# Patient Record
Sex: Male | Born: 1955 | Race: White | Hispanic: No | Marital: Married | Smoking: Never smoker
Health system: Southern US, Community
[De-identification: ages and names within clinical notes are randomized; demographics above are authoritative.]

## PROBLEM LIST (undated history)

## (undated) DIAGNOSIS — T4145XA Adverse effect of unspecified anesthetic, initial encounter: Secondary | ICD-10-CM

## (undated) DIAGNOSIS — T8859XA Other complications of anesthesia, initial encounter: Secondary | ICD-10-CM

## (undated) DIAGNOSIS — E7800 Pure hypercholesterolemia, unspecified: Secondary | ICD-10-CM

## (undated) DIAGNOSIS — N400 Enlarged prostate without lower urinary tract symptoms: Secondary | ICD-10-CM

## (undated) DIAGNOSIS — I10 Essential (primary) hypertension: Secondary | ICD-10-CM

## (undated) DIAGNOSIS — M1990 Unspecified osteoarthritis, unspecified site: Secondary | ICD-10-CM

## (undated) HISTORY — PX: APPENDECTOMY: SHX54

## (undated) HISTORY — PX: COLON SURGERY: SHX602

## (undated) HISTORY — PX: KNEE ARTHROSCOPY: SUR90

## (undated) HISTORY — PX: OTHER SURGICAL HISTORY: SHX169

## (undated) HISTORY — PX: VASECTOMY: SHX75

---
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
## 2015-06-13 ENCOUNTER — Other Ambulatory Visit: Payer: Self-pay | Admitting: Orthopaedic Surgery

## 2015-06-23 ENCOUNTER — Encounter (HOSPITAL_COMMUNITY)
Admission: RE | Admit: 2015-06-23 | Discharge: 2015-06-23 | Disposition: A | Discharge status: Home / Self Care | Payer: BLUE CROSS/BLUE SHIELD | Source: Ambulatory Visit | Attending: Orthopaedic Surgery | Admitting: Orthopaedic Surgery

## 2015-06-23 ENCOUNTER — Encounter (HOSPITAL_COMMUNITY): Payer: Self-pay

## 2015-06-23 DIAGNOSIS — R918 Other nonspecific abnormal finding of lung field: Secondary | ICD-10-CM | POA: Diagnosis not present

## 2015-06-23 DIAGNOSIS — Z0183 Encounter for blood typing: Secondary | ICD-10-CM | POA: Insufficient documentation

## 2015-06-23 DIAGNOSIS — Z01812 Encounter for preprocedural laboratory examination: Secondary | ICD-10-CM | POA: Insufficient documentation

## 2015-06-23 DIAGNOSIS — M1611 Unilateral primary osteoarthritis, right hip: Secondary | ICD-10-CM | POA: Insufficient documentation

## 2015-06-23 DIAGNOSIS — I10 Essential (primary) hypertension: Secondary | ICD-10-CM | POA: Diagnosis not present

## 2015-06-23 DIAGNOSIS — Z01818 Encounter for other preprocedural examination: Secondary | ICD-10-CM

## 2015-06-23 HISTORY — DX: Unspecified osteoarthritis, unspecified site: M19.90

## 2015-06-23 HISTORY — DX: Pure hypercholesterolemia, unspecified: E78.00

## 2015-06-23 HISTORY — DX: Essential (primary) hypertension: I10

## 2015-06-23 LAB — URINALYSIS, ROUTINE W REFLEX MICROSCOPIC
Bilirubin Urine: NEGATIVE
GLUCOSE, UA: NEGATIVE mg/dL
HGB URINE DIPSTICK: NEGATIVE
KETONES UR: NEGATIVE mg/dL
Leukocytes, UA: NEGATIVE
Nitrite: NEGATIVE
Protein, ur: NEGATIVE mg/dL
Specific Gravity, Urine: 1.015 (ref 1.005–1.030)
UROBILINOGEN UA: 0.2 mg/dL (ref 0.0–1.0)
pH: 6 (ref 5.0–8.0)

## 2015-06-23 LAB — TYPE AND SCREEN
ABO/RH(D): A POS
Antibody Screen: NEGATIVE

## 2015-06-23 LAB — ABO/RH: ABO/RH(D): A POS

## 2015-06-23 LAB — CBC WITH DIFFERENTIAL/PLATELET
Basophils Absolute: 0 10*3/uL (ref 0.0–0.1)
Basophils Relative: 0 % (ref 0–1)
EOS PCT: 2 % (ref 0–5)
Eosinophils Absolute: 0.1 10*3/uL (ref 0.0–0.7)
HEMATOCRIT: 45.1 % (ref 39.0–52.0)
Hemoglobin: 14.9 g/dL (ref 13.0–17.0)
LYMPHS PCT: 26 % (ref 12–46)
Lymphs Abs: 1.9 10*3/uL (ref 0.7–4.0)
MCH: 31.2 pg (ref 26.0–34.0)
MCHC: 33 g/dL (ref 30.0–36.0)
MCV: 94.4 fL (ref 78.0–100.0)
MONO ABS: 0.5 10*3/uL (ref 0.1–1.0)
Monocytes Relative: 7 % (ref 3–12)
NEUTROS ABS: 4.6 10*3/uL (ref 1.7–7.7)
Neutrophils Relative %: 65 % (ref 43–77)
Platelets: 205 10*3/uL (ref 150–400)
RBC: 4.78 MIL/uL (ref 4.22–5.81)
RDW: 13.3 % (ref 11.5–15.5)
WBC: 7.2 10*3/uL (ref 4.0–10.5)

## 2015-06-23 LAB — PROTIME-INR
INR: 1.02 (ref 0.00–1.49)
PROTHROMBIN TIME: 13.7 s (ref 11.6–15.2)

## 2015-06-23 LAB — BASIC METABOLIC PANEL
Anion gap: 7 (ref 5–15)
BUN: 15 mg/dL (ref 6–20)
CO2: 30 mmol/L (ref 22–32)
Calcium: 9.6 mg/dL (ref 8.9–10.3)
Chloride: 101 mmol/L (ref 101–111)
Creatinine, Ser: 1.36 mg/dL — ABNORMAL HIGH (ref 0.61–1.24)
GFR calc Af Amer: >60 mL/min (ref >60)
GFR, EST NON AFRICAN AMERICAN: 55 mL/min — AB (ref >60)
Glucose, Bld: 124 mg/dL — ABNORMAL HIGH (ref 65–99)
POTASSIUM: 3.7 mmol/L (ref 3.5–5.1)
SODIUM: 138 mmol/L (ref 135–145)

## 2015-06-23 LAB — APTT: aPTT: 27 seconds (ref 24–37)

## 2015-06-23 LAB — SURGICAL PCR SCREEN
MRSA, PCR: NEGATIVE
STAPHYLOCOCCUS AUREUS: POSITIVE — AB

## 2015-06-23 NOTE — Progress Notes (Signed)
PCP  Is Dr. Tawny Asal @ [facility] in [place] Saw Dr. Sondra Come, who is with [facility]  [phone].  LOV was back in June he thinks. Had Echo and Cardiolite tests done around 4 yrs ago (his wife worked there and wanted him in tip top shape) no real compaints or issues tho.  (will request that info).

## 2015-06-23 NOTE — Pre-Procedure Instructions (Signed)
Timothy Rich  06/23/2015      [facility] [address], [place] [address] Phone: [phone] Fax: [phone]    Your procedure is scheduled on  Tuesday 07/04/15  Report to [facility] Admitting at 820 A.M.  Call this number if you have problems the morning of surgery:  [phone]   Remember:  Do not eat food or drink liquids after midnight.  Take these medicines the morning of surgery with A SIP OF WATER   (STOP DICLOFENAC/ VOLTAREN)   Do not wear jewelry - no rings or watches.  Do not wear lotions or colognes.   You may NOT wear deodorant the day of surgery.             Men may shave face and neck.   Do not bring valuables to the hospital.  [facility] is not responsible for any belongings or valuables.  Contacts, dentures or bridgework may not be worn into surgery.  Leave your suitcase in the car.  After surgery it may be brought to your room. For patients admitted to the hospital, discharge time will be determined by your treatment team.  Name and phone number of your driver:    Special instructions:  [place] - Preparing for Surgery  Before surgery, you can play an important role.  Because skin is not sterile, your skin needs to be as free of germs as possible.  You can reduce the number of germs on you skin by washing with CHG (chlorahexidine gluconate) soap before surgery.  CHG is an antiseptic cleaner which kills germs and bonds with the skin to continue killing germs even after washing.  Please DO NOT use if you have an allergy to CHG or antibacterial soaps.  If your skin becomes reddened/irritated stop using the CHG and inform your nurse when you arrive at Short Stay.  Do not shave (including legs and underarms) for at least 48 hours prior to the first CHG shower.  You may shave your face.  Please follow these instructions carefully:   1.  Shower with CHG Soap the night before surgery and the                                 morning of Surgery.  2.  If you choose to wash your hair, wash your hair first as usual with your normal shampoo.  3.  After you shampoo, rinse your hair and body thoroughly to remove the shampoo.  4.  Use CHG as you would any other liquid soap.  You can apply chg directly to the skin and wash gently with scrungie or a clean washcloth.  5.  Apply the CHG Soap to your body ONLY FROM THE NECK DOWN.  Do not use on open wounds or open sores.  Avoid contact with your eyes, ears, mouth and genitals (private parts).  Wash genitals (private parts)  with your normal soap.  6.  Wash thoroughly, paying special attention to the area where your surgery will be performed.  7.  Thoroughly rinse your body with warm water from the neck down.  8.  DO NOT shower/wash with your normal soap after using and rinsing off  the CHG Soap.  9.  Pat yourself dry with a clean towel.            10.  Wear clean pajamas.  11.  Place clean sheets on your bed the night of your first shower and do not  sleep with pets.  Day of Surgery  Do not apply any lotions/deoderants the morning of surgery.  Please wear clean clothes to the hospital/surgery center.    Please read over the following fact sheets that you were given. Pain Booklet, Coughing and Deep Breathing, Blood Transfusion Information, MRSA Information and Surgical Site Infection Prevention

## 2015-06-30 NOTE — H&P (Signed)
TOTAL HIP ADMISSION H&P  Patient is admitted for right total hip arthroplasty.  Subjective:  Chief Complaint: right hip pain  HPI: Timothy Rich, 59 y.o. male, has a history of pain and functional disability in the right hip(s) due to arthritis and patient has failed non-surgical conservative treatments for greater than 12 weeks to include NSAID's and/or analgesics, corticosteriod injections, flexibility and strengthening excercises, weight reduction as appropriate and activity modification.  Onset of symptoms was gradual starting 5 years ago with gradually worsening course since that time.The patient noted no past surgery on the right hip(s).  Patient currently rates pain in the right hip at 10 out of 10 with activity. Patient has night pain, worsening of pain with activity and weight bearing, trendelenberg gait, pain that interfers with activities of daily living and crepitus. Patient has evidence of subchondral cysts, subchondral sclerosis, periarticular osteophytes and joint space narrowing by imaging studies. This condition presents safety issues increasing the risk of falls. There is no current active infection.  There are no active problems to display for this patient.  Past Medical History  Diagnosis Date  . High cholesterol   . Hypertension   . Arthritis     Past Surgical History  Procedure Laterality Date  . Colon surgery    . Appendectomy    . Vasectomy    . Knee arthroscopy      left knee  . Pilonidal cysts      as a teen ager    No prescriptions prior to admission   No Known Allergies  Social History  Substance Use Topics  . Smoking status: Never Smoker   . Smokeless tobacco: Not on file  . Alcohol Use: 4.2 oz/week    7 Cans of beer per week     Comment: occasionally    No family history on file.   Review of Systems  Musculoskeletal: Positive for joint pain.       Right hip  All other systems reviewed and are negative.   Objective:  Physical Exam   Constitutional: He is oriented to person, place, and time. He appears well-developed and well-nourished.  HENT:  Head: Normocephalic.  Eyes: Pupils are equal, round, and reactive to light.  Neck: Normal range of motion.  Cardiovascular: Normal rate and regular rhythm.   Respiratory: Effort normal.  GI: Soft.  Musculoskeletal:  Right hip motion is quite limited and very painful. Opposite side moves well. Straight leg raise is negative. Sensation and motor function are intact in his feet with palpable pulses on both sides. There is no palpable lymphadenopathy at his groin.  Neurological: He is alert and oriented to person, place, and time.  Skin: Skin is warm and dry.  Psychiatric: He has a normal mood and affect. His behavior is normal. Judgment and thought content normal.    Vital signs in last 24 hours:    Labs:   There is no height or weight on file to calculate BMI.   Imaging Review Plain radiographs demonstrate severe degenerative joint disease of the right hip(s). The bone quality appears to be good for age and reported activity level.  Assessment/Plan:  End stage arthritis, right hip(s)  The patient history, physical examination, clinical judgement of the provider and imaging studies are consistent with end stage degenerative joint disease of the right hip(s) and total hip arthroplasty is deemed medically necessary. The treatment options including medical management, injection therapy, arthroscopy and arthroplasty were discussed at length. The risks and benefits of total hip  arthroplasty were presented and reviewed. The risks due to aseptic loosening, infection, stiffness, dislocation/subluxation,  thromboembolic complications and other imponderables were discussed.  The patient acknowledged the explanation, agreed to proceed with the plan and consent was signed. Patient is being admitted for inpatient treatment for surgery, pain control, PT, OT, prophylactic antibiotics, VTE  prophylaxis, progressive ambulation and ADL's and discharge planning.The patient is planning to be discharged home with home health services

## 2015-07-03 MED ORDER — LACTATED RINGERS IV SOLN: INTRAVENOUS | Status: DC

## 2015-07-03 MED ORDER — CHLORHEXIDINE GLUCONATE 4 % EX LIQD: 60.0000 mL | Freq: Once | CUTANEOUS | Status: DC

## 2015-07-03 MED ORDER — DEXTROSE 5 % IV SOLN
3.0000 g | INTRAVENOUS | Status: AC
  Administered 2015-07-04: 3 g via INTRAVENOUS
  Filled 2015-07-03 (×2): qty 3000

## 2015-07-03 MED ORDER — CEFAZOLIN SODIUM-DEXTROSE 2-3 GM-% IV SOLR: 2.0000 g | INTRAVENOUS | Status: DC

## 2015-07-04 ENCOUNTER — Inpatient Hospital Stay (HOSPITAL_COMMUNITY)
Admission: RE | Admit: 2015-07-04 | Discharge: 2015-07-06 | DRG: 470 | Disposition: A | Discharge status: Home Health | Payer: BLUE CROSS/BLUE SHIELD | Source: Ambulatory Visit | Attending: Orthopaedic Surgery | Admitting: Orthopaedic Surgery

## 2015-07-04 ENCOUNTER — Inpatient Hospital Stay (HOSPITAL_COMMUNITY): Payer: BLUE CROSS/BLUE SHIELD | Admitting: Anesthesiology

## 2015-07-04 ENCOUNTER — Encounter (HOSPITAL_COMMUNITY)
Admission: RE | Disposition: A | Discharge status: Home Health | Payer: Self-pay | Source: Ambulatory Visit | Attending: Orthopaedic Surgery

## 2015-07-04 ENCOUNTER — Inpatient Hospital Stay (HOSPITAL_COMMUNITY): Payer: BLUE CROSS/BLUE SHIELD

## 2015-07-04 ENCOUNTER — Encounter (HOSPITAL_COMMUNITY): Payer: Self-pay | Admitting: *Deleted

## 2015-07-04 DIAGNOSIS — Z419 Encounter for procedure for purposes other than remedying health state, unspecified: Secondary | ICD-10-CM

## 2015-07-04 DIAGNOSIS — I10 Essential (primary) hypertension: Secondary | ICD-10-CM | POA: Diagnosis present

## 2015-07-04 DIAGNOSIS — E780 Pure hypercholesterolemia: Secondary | ICD-10-CM | POA: Diagnosis present

## 2015-07-04 DIAGNOSIS — Z6835 Body mass index (BMI) 35.0-35.9, adult: Secondary | ICD-10-CM | POA: Diagnosis not present

## 2015-07-04 DIAGNOSIS — M25551 Pain in right hip: Secondary | ICD-10-CM | POA: Diagnosis present

## 2015-07-04 DIAGNOSIS — M1611 Unilateral primary osteoarthritis, right hip: Secondary | ICD-10-CM | POA: Diagnosis present

## 2015-07-04 HISTORY — PX: TOTAL HIP ARTHROPLASTY: SHX124

## 2015-07-04 SURGERY — ARTHROPLASTY, HIP, TOTAL, ANTERIOR APPROACH
Anesthesia: Spinal | Site: Hip | Laterality: Right

## 2015-07-04 MED ORDER — METOCLOPRAMIDE HCL 5 MG/ML IJ SOLN
5.0000 mg | Freq: Three times a day (TID) | INTRAMUSCULAR | Status: DC | PRN
  Administered 2015-07-04: 10 mg via INTRAVENOUS
  Filled 2015-07-04: qty 2

## 2015-07-04 MED ORDER — HYDROCODONE-ACETAMINOPHEN 5-325 MG PO TABS
1.0000 | ORAL_TABLET | ORAL | Status: DC | PRN
  Administered 2015-07-04 – 2015-07-06 (×8): 2 via ORAL
  Filled 2015-07-04 (×8): qty 2

## 2015-07-04 MED ORDER — HYDROMORPHONE HCL 1 MG/ML IJ SOLN
1.0000 mg | INTRAMUSCULAR | Status: DC | PRN
  Administered 2015-07-04 – 2015-07-05 (×2): 1 mg via INTRAVENOUS
  Filled 2015-07-04 (×2): qty 1

## 2015-07-04 MED ORDER — ACETAMINOPHEN 325 MG PO TABS: 650.0000 mg | ORAL_TABLET | Freq: Four times a day (QID) | ORAL | Status: DC | PRN

## 2015-07-04 MED ORDER — GLYCOPYRROLATE 0.2 MG/ML IJ SOLN
INTRAMUSCULAR | Status: DC | PRN
  Administered 2015-07-04: 0.2 mg via INTRAVENOUS

## 2015-07-04 MED ORDER — METOCLOPRAMIDE HCL 5 MG PO TABS: 5.0000 mg | ORAL_TABLET | Freq: Three times a day (TID) | ORAL | Status: DC | PRN

## 2015-07-04 MED ORDER — MENTHOL 3 MG MT LOZG: 1.0000 | LOZENGE | OROMUCOSAL | Status: DC | PRN

## 2015-07-04 MED ORDER — FENTANYL CITRATE (PF) 250 MCG/5ML IJ SOLN
INTRAMUSCULAR | Status: AC
  Filled 2015-07-04: qty 5

## 2015-07-04 MED ORDER — 0.9 % SODIUM CHLORIDE (POUR BTL) OPTIME
TOPICAL | Status: DC | PRN
  Administered 2015-07-04: 1000 mL

## 2015-07-04 MED ORDER — HYDROMORPHONE HCL 1 MG/ML IJ SOLN
0.2500 mg | INTRAMUSCULAR | Status: DC | PRN
  Administered 2015-07-04 (×2): 0.5 mg via INTRAVENOUS

## 2015-07-04 MED ORDER — FENTANYL CITRATE (PF) 250 MCG/5ML IJ SOLN
INTRAMUSCULAR | Status: DC | PRN
  Administered 2015-07-04 (×5): 50 ug via INTRAVENOUS

## 2015-07-04 MED ORDER — ONDANSETRON HCL 4 MG/2ML IJ SOLN
4.0000 mg | Freq: Four times a day (QID) | INTRAMUSCULAR | Status: DC | PRN
  Administered 2015-07-04: 4 mg via INTRAVENOUS

## 2015-07-04 MED ORDER — HYDROMORPHONE HCL 1 MG/ML IJ SOLN
INTRAMUSCULAR | Status: AC
  Filled 2015-07-04: qty 1

## 2015-07-04 MED ORDER — PHENOL 1.4 % MT LIQD: 1.0000 | OROMUCOSAL | Status: DC | PRN

## 2015-07-04 MED ORDER — LACTATED RINGERS IV SOLN
INTRAVENOUS | Status: DC
  Administered 2015-07-04 (×3): via INTRAVENOUS

## 2015-07-04 MED ORDER — PROPOFOL 10 MG/ML IV BOLUS
INTRAVENOUS | Status: AC
  Filled 2015-07-04: qty 20

## 2015-07-04 MED ORDER — PHENYLEPHRINE HCL 10 MG/ML IJ SOLN
INTRAMUSCULAR | Status: DC | PRN
  Administered 2015-07-04 (×2): 40 ug via INTRAVENOUS

## 2015-07-04 MED ORDER — DOCUSATE SODIUM 100 MG PO CAPS
100.0000 mg | ORAL_CAPSULE | Freq: Two times a day (BID) | ORAL | Status: DC
  Administered 2015-07-04 – 2015-07-06 (×4): 100 mg via ORAL
  Filled 2015-07-04 (×4): qty 1

## 2015-07-04 MED ORDER — LACTATED RINGERS IV SOLN
INTRAVENOUS | Status: DC
  Administered 2015-07-04: 75 mL/h via INTRAVENOUS

## 2015-07-04 MED ORDER — MIDAZOLAM HCL 2 MG/2ML IJ SOLN
INTRAMUSCULAR | Status: AC
  Filled 2015-07-04: qty 4

## 2015-07-04 MED ORDER — ONDANSETRON HCL 4 MG PO TABS: 4.0000 mg | ORAL_TABLET | Freq: Four times a day (QID) | ORAL | Status: DC | PRN

## 2015-07-04 MED ORDER — ASPIRIN EC 325 MG PO TBEC
325.0000 mg | DELAYED_RELEASE_TABLET | Freq: Two times a day (BID) | ORAL | Status: DC
  Administered 2015-07-04 – 2015-07-06 (×4): 325 mg via ORAL
  Filled 2015-07-04 (×4): qty 1

## 2015-07-04 MED ORDER — MIDAZOLAM HCL 2 MG/2ML IJ SOLN
INTRAMUSCULAR | Status: DC | PRN
  Administered 2015-07-04: 2 mg via INTRAVENOUS

## 2015-07-04 MED ORDER — LISINOPRIL 10 MG PO TABS
10.0000 mg | ORAL_TABLET | Freq: Every day | ORAL | Status: DC
  Administered 2015-07-05 – 2015-07-06 (×2): 10 mg via ORAL
  Filled 2015-07-04 (×2): qty 1

## 2015-07-04 MED ORDER — BISACODYL 10 MG RE SUPP: 10.0000 mg | Freq: Every day | RECTAL | Status: DC | PRN

## 2015-07-04 MED ORDER — METHOCARBAMOL 500 MG PO TABS
500.0000 mg | ORAL_TABLET | Freq: Four times a day (QID) | ORAL | Status: DC | PRN
  Administered 2015-07-04 – 2015-07-06 (×4): 500 mg via ORAL
  Filled 2015-07-04 (×5): qty 1

## 2015-07-04 MED ORDER — GLYCOPYRROLATE 0.2 MG/ML IJ SOLN
INTRAMUSCULAR | Status: AC
  Filled 2015-07-04: qty 1

## 2015-07-04 MED ORDER — TRANEXAMIC ACID 1000 MG/10ML IV SOLN
1000.0000 mg | INTRAVENOUS | Status: AC
  Administered 2015-07-04: 1000 mg via INTRAVENOUS
  Filled 2015-07-04: qty 10

## 2015-07-04 MED ORDER — FENOFIBRATE 160 MG PO TABS
160.0000 mg | ORAL_TABLET | Freq: Every day | ORAL | Status: DC
  Administered 2015-07-04 – 2015-07-06 (×3): 160 mg via ORAL
  Filled 2015-07-04 (×3): qty 1

## 2015-07-04 MED ORDER — ACETAMINOPHEN 650 MG RE SUPP: 650.0000 mg | Freq: Four times a day (QID) | RECTAL | Status: DC | PRN

## 2015-07-04 MED ORDER — EPHEDRINE SULFATE 50 MG/ML IJ SOLN
INTRAMUSCULAR | Status: DC | PRN
  Administered 2015-07-04: 10 mg via INTRAVENOUS

## 2015-07-04 MED ORDER — FERROUS SULFATE 325 (65 FE) MG PO TABS
325.0000 mg | ORAL_TABLET | Freq: Two times a day (BID) | ORAL | Status: DC
  Administered 2015-07-04 – 2015-07-06 (×4): 325 mg via ORAL
  Filled 2015-07-04 (×5): qty 1

## 2015-07-04 MED ORDER — PHENYLEPHRINE 40 MCG/ML (10ML) SYRINGE FOR IV PUSH (FOR BLOOD PRESSURE SUPPORT)
PREFILLED_SYRINGE | INTRAVENOUS | Status: AC
  Filled 2015-07-04: qty 10

## 2015-07-04 MED ORDER — EPHEDRINE SULFATE 50 MG/ML IJ SOLN
INTRAMUSCULAR | Status: AC
  Filled 2015-07-04: qty 1

## 2015-07-04 MED ORDER — CEFAZOLIN SODIUM-DEXTROSE 2-3 GM-% IV SOLR
2.0000 g | Freq: Four times a day (QID) | INTRAVENOUS | Status: AC
  Administered 2015-07-04 – 2015-07-05 (×2): 2 g via INTRAVENOUS
  Filled 2015-07-04 (×2): qty 50

## 2015-07-04 MED ORDER — ATORVASTATIN CALCIUM 80 MG PO TABS
80.0000 mg | ORAL_TABLET | Freq: Every day | ORAL | Status: DC
  Administered 2015-07-04 – 2015-07-06 (×3): 80 mg via ORAL
  Filled 2015-07-04 (×3): qty 1

## 2015-07-04 MED ORDER — METHOCARBAMOL 1000 MG/10ML IJ SOLN
500.0000 mg | Freq: Four times a day (QID) | INTRAVENOUS | Status: DC | PRN
  Filled 2015-07-04: qty 5

## 2015-07-04 MED ORDER — ALUM & MAG HYDROXIDE-SIMETH 200-200-20 MG/5ML PO SUSP: 30.0000 mL | ORAL | Status: DC | PRN

## 2015-07-04 MED ORDER — ONDANSETRON HCL 4 MG/2ML IJ SOLN
INTRAMUSCULAR | Status: AC
  Filled 2015-07-04: qty 2

## 2015-07-04 MED ORDER — PHENYLEPHRINE HCL 10 MG/ML IJ SOLN
10.0000 mg | INTRAVENOUS | Status: DC | PRN
  Administered 2015-07-04: 10 ug/min via INTRAVENOUS

## 2015-07-04 MED ORDER — PROMETHAZINE HCL 25 MG/ML IJ SOLN: 6.2500 mg | INTRAMUSCULAR | Status: DC | PRN

## 2015-07-04 SURGICAL SUPPLY — 49 items
BLADE SAW SGTL 18X1.27X75 (BLADE) ×2 IMPLANT
BLADE SAW SGTL 18X1.27X75MM (BLADE) ×1
BLADE SURG ROTATE 9660 (MISCELLANEOUS) IMPLANT
CAPT HIP TOTAL 2 ×3 IMPLANT
CELLS DAT CNTRL 66122 CELL SVR (MISCELLANEOUS) ×1 IMPLANT
COVER PERINEAL POST (MISCELLANEOUS) ×3 IMPLANT
COVER SURGICAL LIGHT HANDLE (MISCELLANEOUS) ×3 IMPLANT
DRAPE C-ARM 42X72 X-RAY (DRAPES) ×3 IMPLANT
DRAPE IMP U-DRAPE 54X76 (DRAPES) ×3 IMPLANT
DRAPE STERI IOBAN 125X83 (DRAPES) ×3 IMPLANT
DRAPE U-SHAPE 47X51 STRL (DRAPES) ×9 IMPLANT
DRSG AQUACEL AG ADV 3.5X10 (GAUZE/BANDAGES/DRESSINGS) ×3 IMPLANT
DURAPREP 26ML APPLICATOR (WOUND CARE) ×3 IMPLANT
ELECT BLADE 4.0 EZ CLEAN MEGAD (MISCELLANEOUS) ×3
ELECT CAUTERY BLADE 6.4 (BLADE) ×3 IMPLANT
ELECT REM PT RETURN 9FT ADLT (ELECTROSURGICAL) ×3
ELECTRODE BLDE 4.0 EZ CLN MEGD (MISCELLANEOUS) ×1 IMPLANT
ELECTRODE REM PT RTRN 9FT ADLT (ELECTROSURGICAL) ×1 IMPLANT
FACESHIELD WRAPAROUND (MASK) ×3 IMPLANT
GLOVE BIO SURGEON STRL SZ8 (GLOVE) ×15 IMPLANT
GLOVE BIOGEL PI IND STRL 8 (GLOVE) ×2 IMPLANT
GLOVE BIOGEL PI INDICATOR 8 (GLOVE) ×4
GOWN STRL REUS W/ TWL LRG LVL3 (GOWN DISPOSABLE) ×1 IMPLANT
GOWN STRL REUS W/ TWL XL LVL3 (GOWN DISPOSABLE) ×2 IMPLANT
GOWN STRL REUS W/TWL LRG LVL3 (GOWN DISPOSABLE) ×2
GOWN STRL REUS W/TWL XL LVL3 (GOWN DISPOSABLE) ×4
KIT BASIN OR (CUSTOM PROCEDURE TRAY) ×3 IMPLANT
KIT ROOM TURNOVER OR (KITS) ×3 IMPLANT
LINER BOOT UNIVERSAL DISP (MISCELLANEOUS) IMPLANT
MANIFOLD NEPTUNE II (INSTRUMENTS) ×3 IMPLANT
NS IRRIG 1000ML POUR BTL (IV SOLUTION) ×3 IMPLANT
PACK TOTAL JOINT (CUSTOM PROCEDURE TRAY) ×3 IMPLANT
PACK UNIVERSAL I (CUSTOM PROCEDURE TRAY) ×3 IMPLANT
PAD ARMBOARD 7.5X6 YLW CONV (MISCELLANEOUS) ×6 IMPLANT
RTRCTR WOUND ALEXIS 18CM MED (MISCELLANEOUS) ×3
STAPLER VISISTAT 35W (STAPLE) ×3 IMPLANT
SUT ETHIBOND NAB CT1 #1 30IN (SUTURE) ×6 IMPLANT
SUT VIC AB 0 CT1 27 (SUTURE) ×2
SUT VIC AB 0 CT1 27XBRD ANBCTR (SUTURE) ×1 IMPLANT
SUT VIC AB 1 CT1 27 (SUTURE) ×2
SUT VIC AB 1 CT1 27XBRD ANBCTR (SUTURE) ×1 IMPLANT
SUT VIC AB 2-0 CT1 27 (SUTURE) ×2
SUT VIC AB 2-0 CT1 TAPERPNT 27 (SUTURE) ×1 IMPLANT
SUT VLOC 180 0 24IN GS25 (SUTURE) ×3 IMPLANT
TOWEL OR 17X24 6PK STRL BLUE (TOWEL DISPOSABLE) ×3 IMPLANT
TOWEL OR 17X26 10 PK STRL BLUE (TOWEL DISPOSABLE) ×6 IMPLANT
TRAY FOLEY CATH 14FR (SET/KITS/TRAYS/PACK) IMPLANT
WATER STERILE IRR 1000ML POUR (IV SOLUTION) IMPLANT
YANKAUER SUCT BULB TIP NO VENT (SUCTIONS) ×3 IMPLANT

## 2015-07-04 NOTE — Interval H&P Note (Signed)
OK for surgery PD 

## 2015-07-04 NOTE — Anesthesia Procedure Notes (Addendum)
Spinal Patient location during procedure: OR End time: 07/04/2015 10:20 AM Staffing Anesthesiologist: MASSAGEE, TERRY Performed by: anesthesiologist  Preanesthetic Checklist Completed: patient identified, site marked, surgical consent, pre-op evaluation, timeout performed, IV checked, risks and benefits discussed and monitors and equipment checked Spinal Block Patient position: sitting Prep: ChloraPrep Patient monitoring: heart rate, cardiac monitor, continuous pulse ox and blood pressure Approach: right paramedian Location: L3-4 Injection technique: single-shot Needle Needle type: Quincke  Needle gauge: 25 G Needle length: 9 cm Needle insertion depth: 6 cm Assessment Sensory level: T6 Additional Notes Tolerated well  Procedure Name: LMA Insertion Date/Time: 07/04/2015 10:42 AM Performed by: Vennie Homans Pre-anesthesia Checklist: Patient identified, Emergency Drugs available, Suction available, Patient being monitored and Timeout performed Patient Re-evaluated:Patient Re-evaluated prior to inductionOxygen Delivery Method: Circle system utilized Preoxygenation: Pre-oxygenation with 100% oxygen Intubation Type: IV induction Ventilation: Mask ventilation without difficulty LMA: LMA inserted LMA Size: 4.0 Placement Confirmation: positive ETCO2 and breath sounds checked- equal and bilateral Tube secured with: Tape Dental Injury: Teeth and Oropharynx as per pre-operative assessment

## 2015-07-04 NOTE — Anesthesia Preprocedure Evaluation (Signed)
Anesthesia Evaluation  Patient identified by MRN, date of birth, ID band Patient awake    Reviewed: Allergy & Precautions, NPO status , Patient's Chart, lab work & pertinent test results  History of Anesthesia Complications Negative for: history of anesthetic complications  Airway Mallampati: II  TM Distance: >3 FB     Dental  (+) Teeth Intact   Pulmonary neg pulmonary ROS,  breath sounds clear to auscultation        Cardiovascular hypertension, Rhythm:Regular Rate:Normal     Neuro/Psych    GI/Hepatic negative GI ROS, Neg liver ROS,   Endo/Other  Morbid obesity  Renal/GU negative Renal ROS     Musculoskeletal  (+) Arthritis -,   Abdominal   Peds  Hematology   Anesthesia Other Findings   Reproductive/Obstetrics                             Anesthesia Physical Anesthesia Plan  ASA: III  Anesthesia Plan: Spinal   Post-op Pain Management:    Induction: Intravenous  Airway Management Planned: Natural Airway and Simple Face Mask  Additional Equipment:   Intra-op Plan:   Post-operative Plan:   Informed Consent: I have reviewed the patients History and Physical, chart, labs and discussed the procedure including the risks, benefits and alternatives for the proposed anesthesia with the patient or authorized representative who has indicated his/her understanding and acceptance.   Dental advisory given  Plan Discussed with: CRNA and Surgeon  Anesthesia Plan Comments:         Anesthesia Quick Evaluation

## 2015-07-04 NOTE — Plan of Care (Signed)
Problem: Consults Goal: Diagnosis- Total Joint Replacement Primary Total Hip Right     

## 2015-07-04 NOTE — Op Note (Signed)
PRE-OP DIAGNOSIS:  RIGHT HIP DEGENERATIVE JOINT DISEASE POST-OP DIAGNOSIS:  same PROCEDURE: RIGHT TOTAL HIP ARTHROPLASTY ANTERIOR APPROACH ANESTHESIA:  General and spinal SURGEON:  Melrose Nakayama MD ASSISTANT:  RNFA   INDICATIONS FOR PROCEDURE:  The patient is a 59 y.o. male with a long history of a painful hip.  This has persisted despite multiple conservative measures.  The patient has persisted with pain and dysfunction making rest and activity difficult.  A total hip replacement is offered as surgical treatment.  Informed operative consent was obtained after discussion of possible complications including reaction to anesthesia, infection, neurovascular injury, dislocation, DVT, PE, and death.  The importance of the postoperative rehab program to optimize result was stressed with the patient.  SUMMARY OF FINDINGS AND PROCEDURE:  Under general anesthesia through a anterior approach an the Hana table a right THR was performed.  The patient had severe degenerative change and excellent bone quality.  We used DePuy components to replace the hip and these were size KA 13 Corail femur capped with a +8.5 2mm ceramic hip ball.  On the acetabular side we used a size 56 Gription shell with a  plus 0 neutral polyethylene liner.  We did use a hole eliminator.   I used fluoroscopy throughout the case to check position of implants and leg lengths and read all of these views myself.  DESCRIPTION OF PROCEDURE:  The patient was taken to the OR suite where general anesthetic was applied.  The patient was then positioned on the Hana table supine.  All bony prominences were appropriately padded.  Prep and drape was then performed in normal sterile fashion.  The patient was given kefzol preoperative antibiotic and an appropriate time out was performed.  We then took an anterior approach to the right hip.  Dissection was taken through adipose to the tensor fascia lata fascia.  This structure was incised longitudinally and  we dissected in the intermuscular interval just medial to this muscle.  Cobra retractors were placed superior and inferior to the femoral neck superficial to the capsule.  A capsular incision was then made and the retractors were placed along the femoral neck.  Xray was brought in to get a good level for the femoral neck cut which was made with an oscillating saw and osteotome.  The femoral head was removed with a corkscrew.  The acetabulum was exposed and some labral tissues were excised. Reaming was taken to the inside wall of the pelvis and sequentially up to 1 mm smaller than the actual component.  A trial of components was done and then the aforementioned acetabular shell was placed in appropriate tilt and anteversion confirmed by fluoroscopy. The liner was placed along with the hole eliminator and attention was turned to the femur.  The leg was brought down and over into adduction and the elevator bar was used to raise the femur up gently in the wound.  The piriformis was released with care taken to preserve the obturator internus attachment and all of the posterior capsule. The femur was reamed and then broached to the appropriate size.  A trial reduction was done and the aforementioned head and neck assembly gave [place] the best stability in extension with external rotation.  Leg lengths were felt to be about equal by fluoroscopic exam.  The trial components were removed and the wound irrigated.  We then placed the femoral component in appropriate anteversion.  The head was applied to a dry stem neck and the hip again reduced.  It was again stable in the aforementioned position.  The would was irrigated again followed by re-approximation of anterior capsule with ethibond suture. Tensor fascia was repaired with V-loc suture  followed by subQ reapprox with vicryl in several layers..  Skin was closed with staples followed by a sterile dressing.  EBL and IOF can be obtained from anesthesia records.  DISPOSITION:   The patient was extubated in the OR and taken to PACU in stable condition to be admitted to the Orthopedic Surgery for appropriate post-op care to include perioperative antibiotics and DVT prophylaxis.

## 2015-07-04 NOTE — Anesthesia Postprocedure Evaluation (Signed)
  Anesthesia Post-op Note  Patient: Timothy Rich  Procedure(s) Performed: Procedure(s): TOTAL HIP ARTHROPLASTY ANTERIOR APPROACH (Right)  Patient Location: PACU  Anesthesia Type:General and Spinal  Level of Consciousness: awake and alert   Airway and Oxygen Therapy: Patient Spontanous Breathing  Post-op Pain: mild  Post-op Assessment: Post-op Vital signs reviewed LLE Motor Response: Purposeful movement   RLE Motor Response: Purposeful movement   L Sensory Level: S1-Sole of foot, small toes R Sensory Level: S1-Sole of foot, small toes  Post-op Vital Signs: stable  Last Vitals:  Filed Vitals:   07/04/15 1412  BP: 102/58  Pulse: 64  Temp:   Resp: 18    Complications: No apparent anesthesia complications

## 2015-07-04 NOTE — Progress Notes (Signed)
Physical Therapy Evaluation Patient Details Name: Timothy Rich MRN: [id number] DOB: 12-17-1955 Today's Date: 07/04/2015   History of Present Illness  59 y.o. s/p right TOTAL HIP ARTHROPLASTY.  Clinical Impression  Pt is presents following the above procedure, with the deficits listed below (see PT Problem List). Limited by nausea during evaluation, although able to safely stand-pivot transfer with min guard assist from bed to chair post-op day #0, demonstrating ability to bear majority of weight through RLE and use RW minimally . Anticipate pt will progress quickly towards functional goals. Good family support from daughter and wife. Pt will benefit from skilled PT to increase their independence and safety with mobility to allow discharge to the venue listed below.      Follow Up Recommendations Home health PT;Supervision for mobility/OOB    Equipment Recommendations  None recommended by PT    Recommendations for Other Services OT consult     Precautions / Restrictions Precautions Precautions: None Precaution Comments: Direct anterior Restrictions Weight Bearing Restrictions: Yes RLE Weight Bearing: Weight bearing as tolerated      Mobility  Bed Mobility Overal bed mobility: Needs Assistance Bed Mobility: Supine to Sit     Supine to sit: Supervision;HOB elevated     General bed mobility comments: Supervision for safety. Use of rails. VC for technique. did not require assist for RLE.  Transfers Overall transfer level: Needs assistance Equipment used: Rolling walker (2 wheeled) Transfers: Sit to/from [facility] Sit to Stand: Min guard Stand pivot transfers: Min guard       General transfer comment: Close guard for safety. VC for hand placement and technique. Mild lightheadedness upon standing. Pre-gait activity with weight shifting. Able to take small pivotal steps to chair with pivot and no buckling noted. VC for sequencing.  Ambulation/Gait                 Stairs            Wheelchair Mobility    Modified Rankin (Stroke Patients Only)       Balance Overall balance assessment: Needs assistance Sitting-balance support: No upper extremity supported;Feet supported Sitting balance-Leahy Scale: Good     Standing balance support: No upper extremity supported Standing balance-Leahy Scale: Fair                               Pertinent Vitals/Pain Pain Assessment: 0-10 Pain Score:  ("I feel it pretty good right now" No value given) Pain Location: Rt hip Pain Intervention(s): Monitored during session;Repositioned;Limited activity within patient's tolerance    Home Living Family/patient expects to be discharged to:: Private residence Living Arrangements: Spouse/significant other;Children Available Help at Discharge: Family;Available 24 hours/day Type of Home: House Home Access: Stairs to enter Entrance Stairs-Rails: None Entrance Stairs-Number of Steps: 1 Home Layout: One level Home Equipment: Walker - 2 wheels;Cane - single point;Bedside commode      Prior Function Level of Independence: Independent         Comments: Retired, plays gold     Journalist, newspaper        Extremity/Trunk Assessment   Upper Extremity Assessment: Defer to OT evaluation           Lower Extremity Assessment: RLE deficits/detail RLE Deficits / Details: decreased strength and ROM as expected post op       Communication   Communication: No difficulties  Cognition Arousal/Alertness: Awake/alert Behavior During Therapy: WFL for tasks assessed/performed Overall Cognitive Status: Within  Functional Limits for tasks assessed                      General Comments General comments (skin integrity, edema, etc.): Pt with vomiting upon sitting EOB. Reports feeling better after transfering to chair.    Exercises Total Joint Exercises Ankle Circles/Pumps: AROM;Both;10 reps;Seated Quad Sets: Strengthening;Both;5  reps;Seated      Assessment/Plan    PT Assessment Patient needs continued PT services  PT Diagnosis Difficulty walking;Acute pain   PT Problem List Decreased strength;Decreased range of motion;Decreased activity tolerance;Decreased balance;Decreased mobility;Decreased knowledge of use of DME;Pain  PT Treatment Interventions DME instruction;Gait training;Stair training;Functional mobility training;Therapeutic activities;Therapeutic exercise;Balance training;Neuromuscular re-education;Patient/family education;Modalities   PT Goals (Current goals can be found in the Care Plan section) Acute Rehab PT Goals Patient Stated Goal: Not feel sick PT Goal Formulation: With patient Time For Goal Achievement: 07/18/15 Potential to Achieve Goals: Good    Frequency 7X/week   Barriers to discharge        Co-evaluation               End of Session   Activity Tolerance: Other (comment) (Limited by nausea) Patient left: in chair;with call bell/phone within reach;with family/visitor present;with SCD's reapplied Nurse Communication: Mobility status (Nausea)         Time: 5997-7414 PT Time Calculation (min) (ACUTE ONLY): 23 min   Charges:   PT Evaluation $Initial PT Evaluation Tier I: 1 Procedure PT Treatments $Therapeutic Activity: 8-22 mins   PT G CodesEllouise Newer 07/04/2015, 6:02 PM Camille Bal [place], [place]

## 2015-07-04 NOTE — Transfer of Care (Signed)
Immediate Anesthesia Transfer of Care Note  Patient: Timothy Rich  Procedure(s) Performed: Procedure(s): TOTAL HIP ARTHROPLASTY ANTERIOR APPROACH (Right)  Patient Location: PACU  Anesthesia Type:General  Level of Consciousness: awake, alert , oriented, patient cooperative and responds to stimulation  Airway & Oxygen Therapy: Patient Spontanous Breathing and Patient connected to nasal cannula oxygen  Post-op Assessment: Report given to RN, Post -op Vital signs reviewed and stable and Patient able to stick tongue midline  Post vital signs: stable  Last Vitals:  Filed Vitals:   07/04/15 1311  BP:   Pulse:   Temp: 37.1 C  Resp:     Complications: No apparent anesthesia complications

## 2015-07-05 ENCOUNTER — Encounter (HOSPITAL_COMMUNITY): Payer: Self-pay | Admitting: Orthopaedic Surgery

## 2015-07-05 LAB — BASIC METABOLIC PANEL
ANION GAP: 8 (ref 5–15)
BUN: 10 mg/dL (ref 6–20)
CALCIUM: 8.1 mg/dL — AB (ref 8.9–10.3)
CO2: 27 mmol/L (ref 22–32)
Chloride: 96 mmol/L — ABNORMAL LOW (ref 101–111)
Creatinine, Ser: 1.1 mg/dL (ref 0.61–1.24)
Glucose, Bld: 129 mg/dL — ABNORMAL HIGH (ref 65–99)
Potassium: 3.7 mmol/L (ref 3.5–5.1)
SODIUM: 131 mmol/L — AB (ref 135–145)

## 2015-07-05 LAB — CBC
HCT: 32.7 % — ABNORMAL LOW (ref 39.0–52.0)
Hemoglobin: 10.9 g/dL — ABNORMAL LOW (ref 13.0–17.0)
MCH: 30.6 pg (ref 26.0–34.0)
MCHC: 33.3 g/dL (ref 30.0–36.0)
MCV: 91.9 fL (ref 78.0–100.0)
PLATELETS: 186 10*3/uL (ref 150–400)
RBC: 3.56 MIL/uL — ABNORMAL LOW (ref 4.22–5.81)
RDW: 13.1 % (ref 11.5–15.5)
WBC: 9.4 10*3/uL (ref 4.0–10.5)

## 2015-07-05 NOTE — Progress Notes (Signed)
Physical Therapy Treatment Patient Details Name: Timothy Rich MRN: [id number] DOB: 1956-10-27 Today's Date: 07/05/2015    History of Present Illness 59 y.o. s/p right TOTAL HIP ARTHROPLASTY.    PT Comments    Patient progressing well with overall therapy. Patient with new PWB order but percentage not clarified. Will attempt steps later today  Follow Up Recommendations  Home health PT;Supervision for mobility/OOB     Equipment Recommendations  None recommended by PT    Recommendations for Other Services       Precautions / Restrictions Precautions Precautions: None Precaution Comments: Direct anterior Restrictions Weight Bearing Restrictions: Yes RLE Weight Bearing: Partial weight bearing (Per new order by MD on 8/24. Percentage not specified)    Mobility  Bed Mobility               General bed mobility comments: Patient up in recliner before and after session  Transfers Overall transfer level: Needs assistance Equipment used: Rolling walker (2 wheeled)   Sit to Stand: Min guard         General transfer comment: CUes for safe hand placement  Ambulation/Gait Ambulation/Gait assistance: Min guard Ambulation Distance (Feet): 200 Feet Assistive device: Rolling walker (2 wheeled) Gait Pattern/deviations: Step-to pattern;Decreased stance time - right;Decreased step length - left Gait velocity: decreased Gait velocity interpretation: Below normal speed for age/gender General Gait Details: Cues for sequence and positioning of RW. Per MD note, patient to be PWB with gait due to his size (specific status not mentioned.). Patient reported MD "just said not to put all my weight on it"   Stairs            Wheelchair Mobility    Modified Rankin (Stroke Patients Only)       Balance                                    Cognition Arousal/Alertness: Awake/alert Behavior During Therapy: WFL for tasks assessed/performed Overall Cognitive Status:  Within Functional Limits for tasks assessed                      Exercises Total Joint Exercises Quad Sets: AROM;Right;10 reps Heel Slides: AAROM;Right;10 reps Hip ABduction/ADduction: AAROM;Right;10 reps Long Arc Quad: AROM;Right;10 reps    General Comments        Pertinent Vitals/Pain Pain Assessment: No/denies pain    Home Living                      Prior Function            PT Goals (current goals can now be found in the care plan section) Progress towards PT goals: Progressing toward goals    Frequency  7X/week    PT Plan Current plan remains appropriate    Co-evaluation             End of Session Equipment Utilized During Treatment: Gait belt Activity Tolerance: Patient tolerated treatment well Patient left: in chair;with call bell/phone within reach     Time: 0833-0859 PT Time Calculation (min) (ACUTE ONLY): 26 min  Charges:  $Gait Training: 8-22 mins $Therapeutic Exercise: 8-22 mins                    G Codes:      Jacqualyn Posey 07/05/2015, 12:03 PM 07/05/2015 Jacqualyn Posey PTA [phone] pager [phone] office

## 2015-07-05 NOTE — Progress Notes (Signed)
Subjective: 1 Day Post-Op Procedure(s) (LRB): TOTAL HIP ARTHROPLASTY ANTERIOR APPROACH (Right)  Activity level:  OOB with PT Diet tolerance:  regular Voiding:  well Patient reports pain as moderate.    Objective: Vital signs in last 24 hours: Temp:  [97.6 F (36.4 C)-98.7 F (37.1 C)] 98.2 F (36.8 C) (08/24 1761) Pulse Rate:  [43-86] 86 (08/24 0613) Resp:  [13-21] 17 (08/24 0613) BP: (83-175)/(52-92) 126/70 mmHg (08/24 0613) SpO2:  [94 %-100 %] 95 % (08/24 6073) Weight:  [278 lb (126.1 kg)] 278 lb (126.1 kg) (08/23 0820)  Labs:  Recent Labs  07/05/15 0430  HGB 10.9*    Recent Labs  07/05/15 0430  WBC 9.4  RBC 3.56*  HCT 32.7*  PLT 186    Recent Labs  07/05/15 0430  NA 131*  K 3.7  CL 96*  CO2 27  BUN 10  CREATININE 1.10  GLUCOSE 129*  CALCIUM 8.1*   No results for input(s): LABPT, INR in the last 72 hours.  Physical Exam:  Neurologically intact ABD soft Sensation intact distally Intact pulses distally Compartment soft  Assessment/Plan:  1 Day Post-Op Procedure(s) (LRB): TOTAL HIP ARTHROPLASTY ANTERIOR APPROACH (Right) Up with therapy Discharge home with home health tomorrow  Due to his size would like to keep him PWB with walker for a month    Su Duma G 07/05/2015, 7:44 AM

## 2015-07-05 NOTE — Progress Notes (Signed)
Occupational Therapy Evaluation Patient Details Name: Timothy Rich MRN: [id number] DOB: [birth year] Today's Date: 07/05/2015    History of Present Illness 59 y.o. s/p right TOTAL HIP ARTHROPLASTY.   Clinical Impression   Patient presenting with decreased I in self care, decreased balance, decreased safety awareness, and decreased functional mobility/transfers. Patient reports being independent PTA. Patient currently functioning at set up A for UB self care and mod A for LB self care. Patient will benefit from acute OT to increase overall independence in the areas of ADLs, functional mobility, safety, and education in order to safely discharge home.    Follow Up Recommendations  Home health OT    Equipment Recommendations  Tub/shower seat    Recommendations for Other Services  (n/a)     Precautions / Restrictions Precautions Precautions: None Precaution Comments: Direct anterior Restrictions Weight Bearing Restrictions: Yes RLE Weight Bearing: Partial weight bearing      Mobility Bed Mobility      General bed mobility comments: Patient up in recliner before and after session  Transfers Overall transfer level: Needs assistance Equipment used: Rolling walker (2 wheeled) Transfers: Sit to/from [facility] Sit to Stand: Supervision Stand pivot transfers: Min guard       General transfer comment: cues for hand placement    Balance Overall balance assessment: Needs assistance Sitting-balance support: No upper extremity supported;Feet supported Sitting balance-Leahy Scale: Good       Standing balance-Leahy Scale: Fair         ADL Overall ADL's : Needs assistance/impaired     Grooming: Standing;Min guard;Wash/dry hands;Wash/dry face;Oral care;Applying deodorant;Cueing for safety   Upper Body Bathing: Sitting;Set up   Lower Body Bathing: Moderate assistance;Cueing for safety   Upper Body Dressing : Set up   Lower Body Dressing: Moderate  assistance;Cueing for safety   Toilet Transfer: Min guard;Comfort height toilet;RW   Toileting- Clothing Manipulation and Hygiene: Minimal assistance;Cueing for safety               Vision Vision Assessment?: No apparent visual deficits          Pertinent Vitals/Pain Pain Assessment: 0-10 Pain Score: 2  Pain Location: R hip Pain Descriptors / Indicators: Aching;Sore Pain Intervention(s): Repositioned;Monitored during session     Hand Dominance Right   Extremity/Trunk Assessment Upper Extremity Assessment Upper Extremity Assessment: Overall WFL for tasks assessed   Lower Extremity Assessment Lower Extremity Assessment: Defer to PT evaluation   Cervical / Trunk Assessment Cervical / Trunk Assessment: Normal   Communication Communication Communication: No difficulties   Cognition Arousal/Alertness: Awake/alert Behavior During Therapy: WFL for tasks assessed/performed Overall Cognitive Status: Within Functional Limits for tasks assessed                   Home Living Family/patient expects to be discharged to:: Private residence Living Arrangements: Spouse/significant other;Children Available Help at Discharge: Family;Available 24 hours/day Type of Home: House Home Access: Stairs to enter [employer] of Steps: 1 Entrance Stairs-Rails: None Home Layout: One level     Home Equipment: Walker - 2 wheels;Cane - single point;Bedside commode          Prior Functioning/Environment Level of Independence: Independent        Comments: Retired and likes to play golf    OT Diagnosis: Generalized weakness   OT Problem List: Decreased strength;Decreased knowledge of use of DME or AE;Decreased knowledge of precautions;Decreased activity tolerance;Impaired balance (sitting and/or standing);Decreased safety awareness;Pain   OT Treatment/Interventions: Self-care/ADL training;Therapeutic exercise;Balance training;Therapeutic activities;Energy  conservation;DME and/or AE instruction;Patient/family education    OT Goals(Current goals can be found in the care plan section) Acute Rehab OT Goals Patient Stated Goal: to go home OT Goal Formulation: With patient Time For Goal Achievement: 07/19/15 Potential to Achieve Goals: Good ADL Goals Pt Will Perform Upper Body Bathing: with modified independence Pt Will Perform Lower Body Bathing: with modified independence Pt Will Perform Upper Body Dressing: with modified independence Pt Will Perform Lower Body Dressing: with modified independence Pt Will Transfer to Toilet: with modified independence Pt Will Perform Toileting - Clothing Manipulation and hygiene: with modified independence;sit to/from stand Pt Will Perform Tub/Shower Transfer: with supervision;rolling walker;shower seat;ambulating  OT Frequency: Min 2X/week   Barriers to D/C:  none at this time             End of Session Equipment Utilized During Treatment: Rolling walker  Activity Tolerance: Patient tolerated treatment well Patient left: in chair;with call bell/phone within reach;with family/visitor present   Time: 1349-1406 OT Time Calculation (min): 17 min Charges:  OT General Charges $OT Visit: 1 Procedure OT Evaluation $Initial OT Evaluation Tier I: 1 Procedure  Phineas Semen, MS, OTR/L 07/05/2015, 2:58 PM

## 2015-07-05 NOTE — Progress Notes (Signed)
Physical Therapy Treatment Patient Details Name: Timothy Rich MRN: [id number] DOB: 1956/03/25 Today's Date: 07/05/2015    History of Present Illness 59 y.o. s/p right TOTAL HIP ARTHROPLASTY.    PT Comments    Patient progressing well and able to complete step training this afternoon. Anticipate DC tomorrow after AM PT  Follow Up Recommendations  Home health PT;Supervision for mobility/OOB     Equipment Recommendations  None recommended by PT    Recommendations for Other Services       Precautions / Restrictions Precautions Precautions: None Precaution Comments: Direct anterior Restrictions RLE Weight Bearing: Partial weight bearing    Mobility  Bed Mobility               General bed mobility comments: Patient up in recliner before and after session  Transfers Overall transfer level: Needs assistance Equipment used: Rolling walker (2 wheeled)   Sit to Stand: Supervision         General transfer comment: CUes for safe hand placement  Ambulation/Gait Ambulation/Gait assistance: Supervision Ambulation Distance (Feet): 300 Feet Assistive device: Rolling walker (2 wheeled) Gait Pattern/deviations: Step-to pattern;Decreased stance time - right;Decreased step length - left Gait velocity: decreased Gait velocity interpretation: Below normal speed for age/gender General Gait Details: Patient with safe use of RW and following PWB status   Stairs Stairs: Yes Stairs assistance: Min guard Stair Management: Step to pattern;Backwards;With walker;No rails Number of Stairs: 1 General stair comments: Patient practiced steps x2 this session with cues for sequence.   Wheelchair Mobility    Modified Rankin (Stroke Patients Only)       Balance                                    Cognition Arousal/Alertness: Awake/alert Behavior During Therapy: WFL for tasks assessed/performed Overall Cognitive Status: Within Functional Limits for tasks assessed                       Exercises Total Joint Exercises Quad Sets: AROM;Right;10 reps Heel Slides: AAROM;Right;10 reps Hip ABduction/ADduction: AAROM;Right;10 reps Long Arc Quad: AROM;Right;10 reps    General Comments        Pertinent Vitals/Pain Pain Assessment: No/denies pain Pain Intervention(s): Limited activity within patient's tolerance    Home Living                      Prior Function            PT Goals (current goals can now be found in the care plan section) Progress towards PT goals: Progressing toward goals    Frequency  7X/week    PT Plan Current plan remains appropriate    Co-evaluation             End of Session Equipment Utilized During Treatment: Gait belt Activity Tolerance: Patient tolerated treatment well Patient left: in chair;with call bell/phone within reach     Time: 1401-1426 PT Time Calculation (min) (ACUTE ONLY): 25 min  Charges:  $Gait Training: 8-22 mins $Therapeutic Exercise: 8-22 mins                    G Codes:      Jacqualyn Posey 07/05/2015, 2:36 PM

## 2015-07-05 NOTE — Care Management Note (Signed)
Case Management Note  Patient Details  Name: Corleone Biegler MRN: [id number] Date of Birth: 30-Nov-1955  Subjective/Objective:   59 yr old male s/p right total hip arthroplasty.                  Action/Plan:  Case manager spoke with patient and wife concerning home health and DME needs at discharge. Patient states he was preoperatively setup with [facility] in [place], [place]. Case manager called Vaughan Basta @ [facility] to confirm. Start of Care will be Friday July 07, 2015. Patient states he has a rolling walker, a cane and a 3in1. Has family support at discharge.    Expected Discharge Date:    07/06/15              Expected Discharge Plan:   Home with [facility] Referral:  NA  Discharge planning Services  CM Consult  Post Acute Care Choice:  Durable Medical Equipment, Home Health Choice offered to:  Patient  DME Arranged:  N/A DME Agency:  NA  HH Arranged:  PT HH Agency:  [facility]  Status of Service:  Completed, signed off  Medicare Important Message Given:    Date Medicare IM Given:    Medicare IM give by:    Date Additional Medicare IM Given:    Additional Medicare Important Message give by:     If discussed at [place] of Stay Meetings, dates discussed:    Additional Comments:  Ninfa Meeker, RN 07/05/2015, 11:23 AM

## 2015-07-06 MED ORDER — METHOCARBAMOL 500 MG PO TABS: 500.0000 mg | ORAL_TABLET | Freq: Four times a day (QID) | ORAL | Status: DC | PRN

## 2015-07-06 MED ORDER — ASPIRIN 325 MG PO TBEC: 325.0000 mg | DELAYED_RELEASE_TABLET | Freq: Two times a day (BID) | ORAL | Status: DC

## 2015-07-06 MED ORDER — HYDROCODONE-ACETAMINOPHEN 5-325 MG PO TABS: 1.0000 | ORAL_TABLET | ORAL | Status: DC | PRN

## 2015-07-06 NOTE — Discharge Summary (Signed)
Patient ID: Timothy Rich MRN: [id number] DOB/AGE: [birth year] 59 y.o.  Admit date: 07/04/2015 Discharge date: 07/06/2015  Admission Diagnoses:  Active Problems:   Arthritis of right hip   Discharge Diagnoses:  Same  Past Medical History  Diagnosis Date  . High cholesterol   . Hypertension   . Arthritis     Surgeries: Procedure(s): TOTAL HIP ARTHROPLASTY ANTERIOR APPROACH on 07/04/2015   Consultants:    Discharged Condition: Improved  Hospital Course: Nello Corro is an 59 y.o. male who was admitted 07/04/2015 for operative treatment of<principal problem not specified>. Patient has severe unremitting pain that affects sleep, daily activities, and work/hobbies. After pre-op clearance the patient was taken to the operating room on 07/04/2015 and underwent  Procedure(s): TOTAL HIP ARTHROPLASTY ANTERIOR APPROACH.    Patient was given perioperative antibiotics: Anti-infectives    Start     Dose/Rate Route Frequency Ordered Stop   07/04/15 1800  ceFAZolin (ANCEF) IVPB 2 g/50 mL premix     2 g 100 mL/hr over 30 Minutes Intravenous 4 times per day 07/04/15 1558 07/05/15 0103   07/04/15 1000  ceFAZolin (ANCEF) 3 g in dextrose 5 % 50 mL IVPB     3 g 160 mL/hr over 30 Minutes Intravenous To Surgery 07/03/15 1252 07/04/15 1011   07/03/15 1300  ceFAZolin (ANCEF) IVPB 2 g/50 mL premix  Status:  Discontinued     2 g 100 mL/hr over 30 Minutes Intravenous To Surgery 07/03/15 1249 07/03/15 1252       Patient was given sequential compression devices, early ambulation, and chemoprophylaxis to prevent DVT.  Patient benefited maximally from hospital stay and there were no complications.    Recent vital signs: Patient Vitals for the past 24 hrs:  BP Temp Pulse Resp SpO2  07/06/15 0554 132/70 mmHg 98.5 F (36.9 C) 80 18 98 %  07/05/15 2200 134/71 mmHg 98.6 F (37 C) 82 18 98 %  07/05/15 1500 (!) 93/51 mmHg 98.5 F (36.9 C) 80 18 96 %     Recent laboratory studies:  Recent Labs   07/05/15 0430  WBC 9.4  HGB 10.9*  HCT 32.7*  PLT 186  NA 131*  K 3.7  CL 96*  CO2 27  BUN 10  CREATININE 1.10  GLUCOSE 129*  CALCIUM 8.1*     Discharge Medications:     Medication List    STOP taking these medications        aspirin 81 MG tablet  Replaced by:  aspirin 325 MG EC tablet     diclofenac 75 MG EC tablet  Commonly known as:  VOLTAREN     GLUCOSAMINE CHONDROITIN JOINT PO      TAKE these medications        aspirin 325 MG EC tablet  Take 1 tablet (325 mg total) by mouth 2 (two) times daily.     atorvastatin 80 MG tablet  Commonly known as:  LIPITOR  Take 80 mg by mouth daily.     fenofibrate 160 MG tablet  Take 160 mg by mouth daily.     HYDROcodone-acetaminophen 5-325 MG per tablet  Commonly known as:  NORCO/VICODIN  Take 1-2 tablets by mouth every 4 (four) hours as needed (breakthrough pain).     lisinopril 10 MG tablet  Commonly known as:  PRINIVIL,ZESTRIL  Take 10 mg by mouth daily.     methocarbamol 500 MG tablet  Commonly known as:  ROBAXIN  Take 1 tablet (500 mg total) by mouth every 6 (six)  hours as needed for muscle spasms.        Diagnostic Studies: Dg Chest 2 View  06/23/2015   CLINICAL DATA:  Arthroplasty.  Hypertension.  EXAM: CHEST  2 VIEW  COMPARISON:  None.  FINDINGS: Mediastinum and hilar structures are normal. Heart size normal. Pulmonary vascularity normal. Mild basilar subsegmental atelectasis and/or infiltrates. No pleural effusion or pneumothorax.  IMPRESSION: Mild bibasilar subsegmental atelectasis and/or infiltrates.   Electronically Signed   By: Marcello Moores  Register   On: 06/23/2015 10:36   Dg Hip Operative Unilat With Pelvis Right  07/04/2015   CLINICAL DATA:  Right total hip arthroplasty.  EXAM: OPERATIVE RIGHT HIP (WITH PELVIS IF PERFORMED) 1 VIEW  TECHNIQUE: Fluoroscopic spot image(s) were submitted for interpretation post-operatively.  COMPARISON:  None.  FINDINGS: A single intraoperative spot view of the right hip  demonstrates right total hip arthroplasty. No complicating features are identified.  IMPRESSION: Right total hip arthroplasty without complicating features identified.   Electronically Signed   By: Margarette Canada M.D.   On: 07/04/2015 12:46    Disposition: Final discharge disposition not confirmed improved      Discharge Instructions    Call MD / Call 911    Complete by:  As directed   If you experience chest pain or shortness of breath, CALL 911 and be transported to the hospital emergency room.  If you develope a fever above 101 F, pus (white drainage) or increased drainage or redness at the wound, or calf pain, call your surgeon's office.     Constipation Prevention    Complete by:  As directed   Drink plenty of fluids.  Prune juice may be helpful.  You may use a stool softener, such as Colace (over the counter) 100 mg twice a day.  Use MiraLax (over the counter) for constipation as needed.     Diet - low sodium heart healthy    Complete by:  As directed      Increase activity slowly as tolerated    Complete by:  As directed            Follow-up Information    Follow up with Hessie Dibble, MD. Schedule an appointment as soon as possible for a visit in 2 weeks.   Specialty:  Orthopedic Surgery   Contact information:   [place] [address] [phone]       Follow up with [facility].   Why:  Someone from [facility] will contact you concerning start time for therapy. Start date will be Friday , July 07, 2015   Contact information:   [address] [place] [address] [phone]        Signed: Dione Housekeeper 07/06/2015, 10:32 AM

## 2015-07-06 NOTE — Progress Notes (Signed)
Physical Therapy Treatment Patient Details Name: Timothy Rich MRN: [id number] DOB: [birth year] Today's Date: 07/06/2015    History of Present Illness 59 y.o. s/p right TOTAL HIP ARTHROPLASTY.    PT Comments    Patient continues to progress with therapy. Able to practice steps again this AM. Educated and demonstrated standing therex. Patient safe to D/C from a mobility standpoint based on progression towards goals set on PT eval.    Follow Up Recommendations  Home health PT;Supervision for mobility/OOB     Equipment Recommendations  None recommended by PT    Recommendations for Other Services       Precautions / Restrictions Precautions Precautions: None Precaution Comments: Direct anterior Restrictions RLE Weight Bearing: Partial weight bearing    Mobility  Bed Mobility               General bed mobility comments: Patient up in recliner before and after session  Transfers Overall transfer level: Modified independent                  Ambulation/Gait Ambulation/Gait assistance: Modified independent (Device/Increase time) Ambulation Distance (Feet): 600 Feet Assistive device: Rolling walker (2 wheeled) Gait Pattern/deviations: Step-through pattern         Stairs Stairs: Yes Stairs assistance: Supervision Stair Management: Step to pattern;Backwards;No rails Number of Stairs: 1 General stair comments: patient able to recall technique; Wife present  Wheelchair Mobility    Modified Rankin (Stroke Patients Only)       Balance                                    Cognition Arousal/Alertness: Awake/alert Behavior During Therapy: WFL for tasks assessed/performed Overall Cognitive Status: Within Functional Limits for tasks assessed                      Exercises Total Joint Exercises Hip ABduction/ADduction: Right;10 reps;AROM;Standing Knee Flexion: AROM;Right;10 reps;Standing Marching in Standing: AROM;Right;10  reps;Standing    General Comments        Pertinent Vitals/Pain Pain Score: 4  Pain Location: R hip Pain Descriptors / Indicators: Sore;Aching Pain Intervention(s): Monitored during session;Patient requesting pain meds-RN notified    Home Living                      Prior Function            PT Goals (current goals can now be found in the care plan section) Progress towards PT goals: Progressing toward goals    Frequency  7X/week    PT Plan Current plan remains appropriate    Co-evaluation             End of Session   Activity Tolerance: Patient tolerated treatment well Patient left: in chair;with call bell/phone within reach     Time: 1009-1026 PT Time Calculation (min) (ACUTE ONLY): 17 min  Charges:  $Gait Training: 8-22 mins                    G Codes:      Jacqualyn Posey 07/06/2015, 10:29 AM 07/06/2015 Jacqualyn Posey PTA [phone] pager [phone] office

## 2015-07-06 NOTE — Progress Notes (Signed)
07/06/15 1124 nursing Patient discharged to home per wheelchair accompanied by NT and wife. Discharge instructions explained to patient, no further questions.

## 2015-07-06 NOTE — Discharge Instructions (Signed)
INSTRUCTIONS AFTER JOINT REPLACEMENT   o Remove items at home which could result in a fall. This includes throw rugs or furniture in walking pathways o ICE to the affected joint every three hours while awake for 30 minutes at a time, for at least the first 3-5 days, and then as needed for pain and swelling.  Continue to use ice for pain and swelling. You may notice swelling that will progress down to the foot and ankle.  This is normal after surgery.  Elevate your leg when you are not up walking on it.   o Continue to use the breathing machine you got in the hospital (incentive spirometer) which will help keep your temperature down.  It is common for your temperature to cycle up and down following surgery, especially at night when you are not up moving around and exerting yourself.  The breathing machine keeps your lungs expanded and your temperature down.   DIET:  As you were doing prior to hospitalization, we recommend a well-balanced diet.  DRESSING / WOUND CARE / SHOWERING  Keep the surgical dressing until follow up.  The dressing is water proof, so you can shower without any extra covering.  IF THE DRESSING FALLS OFF or the wound gets wet inside, change the dressing with sterile gauze.  Please use good hand washing techniques before changing the dressing.  Do not use any lotions or creams on the incision until instructed by your surgeon.    ACTIVITY  o Increase activity slowly as tolerated, but follow the weight bearing instructions below.   o No driving for 6 weeks or until further direction given by your physician.  You cannot drive while taking narcotics.  o No lifting or carrying greater than 10 lbs. until further directed by your surgeon. o Avoid periods of inactivity such as sitting longer than an hour when not asleep. This helps prevent blood clots.  o You may return to work once you are authorized by your doctor.     WEIGHT BEARING   Weight bearing as tolerated with assist  device (walker, cane, etc) as directed, use it as long as suggested by your surgeon or therapist, typically at least 4-6 weeks. Partial weight to full weight as tolerated   EXERCISES  Results after joint replacement surgery are often greatly improved when you follow the exercise, range of motion and muscle strengthening exercises prescribed by your doctor. Safety measures are also important to protect the joint from further injury. Any time any of these exercises cause you to have increased pain or swelling, decrease what you are doing until you are comfortable again and then slowly increase them. If you have problems or questions, call your caregiver or physical therapist for advice.   Rehabilitation is important following a joint replacement. After just a few days of immobilization, the muscles of the leg can become weakened and shrink (atrophy).  These exercises are designed to build up the tone and strength of the thigh and leg muscles and to improve motion. Often times heat used for twenty to thirty minutes before working out will loosen up your tissues and help with improving the range of motion but do not use heat for the first two weeks following surgery (sometimes heat can increase post-operative swelling).   These exercises can be done on a training (exercise) mat, on the floor, on a table or on a bed. Use whatever works the best and is most comfortable for you.    Use music or  television while you are exercising so that the exercises are a pleasant break in your day. This will make your life better with the exercises acting as a break in your routine that you can look forward to.   Perform all exercises about fifteen times, three times per day or as directed.  You should exercise both the operative leg and the other leg as well.  Exercises include:    Quad Sets - Tighten up the muscle on the front of the thigh (Quad) and hold for 5-10 seconds.    Straight Leg Raises - With your knee  straight (if you were given a brace, keep it on), lift the leg to 60 degrees, hold for 3 seconds, and slowly lower the leg.  Perform this exercise against resistance later as your leg gets stronger.   Leg Slides: Lying on your back, slowly slide your foot toward your buttocks, bending your knee up off the floor (only go as far as is comfortable). Then slowly slide your foot back down until your leg is flat on the floor again.   Angel Wings: Lying on your back spread your legs to the side as far apart as you can without causing discomfort.   Hamstring Strength:  Lying on your back, push your heel against the floor with your leg straight by tightening up the muscles of your buttocks.  Repeat, but this time bend your knee to a comfortable angle, and push your heel against the floor.  You may put a pillow under the heel to make it more comfortable if necessary.   A rehabilitation program following joint replacement surgery can speed recovery and prevent re-injury in the future due to weakened muscles. Contact your doctor or a physical therapist for more information on knee rehabilitation.    CONSTIPATION  Constipation is defined medically as fewer than three stools per week and severe constipation as less than one stool per week.  Even if you have a regular bowel pattern at home, your normal regimen is likely to be disrupted due to multiple reasons following surgery.  Combination of anesthesia, postoperative narcotics, change in appetite and fluid intake all can affect your bowels.   YOU MUST use at least one of the following options; they are listed in order of increasing strength to get the job done.  They are all available over the counter, and you may need to use some, POSSIBLY even all of these options:    Drink plenty of fluids (prune juice may be helpful) and high fiber foods Colace 100 mg by mouth twice a day  Senokot for constipation as directed and as needed Dulcolax (bisacodyl), take with  full glass of water  Miralax (polyethylene glycol) once or twice a day as needed.  If you have tried all these things and are unable to have a bowel movement in the first 3-4 days after surgery call either your surgeon or your primary doctor.    If you experience loose stools or diarrhea, hold the medications until you stool forms back up.  If your symptoms do not get better within 1 week or if they get worse, check with your doctor.  If you experience "the worst abdominal pain ever" or develop nausea or vomiting, please contact the office immediately for further recommendations for treatment.   ITCHING:  If you experience itching with your medications, try taking only a single pain pill, or even half a pain pill at a time.  You can also use Benadryl over  the counter for itching or also to help with sleep.   TED HOSE STOCKINGS:  Use stockings on both legs until for at least 2 weeks or as directed by physician office. They may be removed at night for sleeping.  MEDICATIONS:  See your medication summary on the After Visit Summary that nursing will review with you.  You may have some home medications which will be placed on hold until you complete the course of blood thinner medication.  It is important for you to complete the blood thinner medication as prescribed.  PRECAUTIONS:  If you experience chest pain or shortness of breath - call 911 immediately for transfer to the hospital emergency department.   If you develop a fever greater that 101 F, purulent drainage from wound, increased redness or drainage from wound, foul odor from the wound/dressing, or calf pain - CONTACT YOUR SURGEON.                                                   FOLLOW-UP APPOINTMENTS:  If you do not already have a post-op appointment, please call the office for an appointment to be seen by your surgeon.  Guidelines for how soon to be seen are listed in your After Visit Summary, but are typically between 1-4 weeks after  surgery.  OTHER INSTRUCTIONS:   Knee Replacement:  Do not place pillow under knee, focus on keeping the knee straight while resting. CPM instructions: 0-90 degrees, 2 hours in the morning, 2 hours in the afternoon, and 2 hours in the evening. Place foam block, curve side up under heel at all times except when in CPM or when walking.  DO NOT modify, tear, cut, or change the foam block in any way.  MAKE SURE YOU:   Understand these instructions.   Get help right away if you are not doing well or get worse.    Thank you for letting us be a part of your medical care team.  It is a privilege we respect greatly.  We hope these instructions will help you stay on track for a fast and full recovery!

## 2019-04-23 ENCOUNTER — Emergency Department (HOSPITAL_COMMUNITY)
Admission: EM | Admit: 2019-04-23 | Discharge: 2019-04-24 | Disposition: A | Discharge status: Home / Self Care | Payer: BC Managed Care – PPO | Attending: Emergency Medicine | Admitting: Emergency Medicine

## 2019-04-23 ENCOUNTER — Encounter (HOSPITAL_COMMUNITY): Payer: Self-pay

## 2019-04-23 ENCOUNTER — Other Ambulatory Visit: Payer: Self-pay

## 2019-04-23 DIAGNOSIS — W010XXA Fall on same level from slipping, tripping and stumbling without subsequent striking against object, initial encounter: Secondary | ICD-10-CM

## 2019-04-23 DIAGNOSIS — Y929 Unspecified place or not applicable: Secondary | ICD-10-CM | POA: Diagnosis not present

## 2019-04-23 DIAGNOSIS — S80211A Abrasion, right knee, initial encounter: Secondary | ICD-10-CM | POA: Diagnosis not present

## 2019-04-23 DIAGNOSIS — S0990XA Unspecified injury of head, initial encounter: Secondary | ICD-10-CM | POA: Diagnosis present

## 2019-04-23 DIAGNOSIS — Z7982 Long term (current) use of aspirin: Secondary | ICD-10-CM | POA: Insufficient documentation

## 2019-04-23 DIAGNOSIS — I10 Essential (primary) hypertension: Secondary | ICD-10-CM | POA: Diagnosis not present

## 2019-04-23 DIAGNOSIS — W109XXA Fall (on) (from) unspecified stairs and steps, initial encounter: Secondary | ICD-10-CM | POA: Insufficient documentation

## 2019-04-23 DIAGNOSIS — S0181XA Laceration without foreign body of other part of head, initial encounter: Secondary | ICD-10-CM | POA: Diagnosis not present

## 2019-04-23 DIAGNOSIS — Y939 Activity, unspecified: Secondary | ICD-10-CM | POA: Insufficient documentation

## 2019-04-23 DIAGNOSIS — S50811A Abrasion of right forearm, initial encounter: Secondary | ICD-10-CM | POA: Diagnosis not present

## 2019-04-23 DIAGNOSIS — Z79899 Other long term (current) drug therapy: Secondary | ICD-10-CM | POA: Diagnosis not present

## 2019-04-23 DIAGNOSIS — Y999 Unspecified external cause status: Secondary | ICD-10-CM | POA: Diagnosis not present

## 2019-04-23 MED ORDER — LIDOCAINE HCL (PF) 2 % IJ SOLN
INTRAMUSCULAR | Status: AC
  Administered 2019-04-24
  Filled 2019-04-23: qty 20

## 2019-04-23 MED ORDER — POVIDONE-IODINE 10 % EX SOLN
CUTANEOUS | Status: AC
  Administered 2019-04-24
  Filled 2019-04-23: qty 15

## 2019-04-23 NOTE — ED Provider Notes (Signed)
[facility] EMERGENCY DEPARTMENT Provider Note   CSN: [id number] Arrival date & time: 04/23/19  2226     History   Chief Complaint Chief Complaint  Patient presents with  . Fall    HPI Timothy Rich is a 63 y.o. male.   The history is provided by the patient.  Fall  He has history of hypertension and hyperlipidemia and states that he tripped going down some steps and hit his head suffering a laceration.  He denies loss of consciousness.  Denies dizziness, lightheadedness, and coordination.  He also suffered minor abrasions to his right knee and right forearm.  Last tetanus immunization was about 1 year ago.  Past Medical History:  Diagnosis Date  . Arthritis   . High cholesterol   . Hypertension     Patient Active Problem List   Diagnosis Date Noted  . Arthritis of right hip 07/04/2015    Past Surgical History:  Procedure Laterality Date  . APPENDECTOMY    . COLON SURGERY    . KNEE ARTHROSCOPY     left knee  . pilonidal cysts     as a teen ager  . 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 Authorizing Provider  aspirin EC 325 MG EC tablet Take 1 tablet (325 mg total) by mouth 2 (two) times daily. 07/06/15   Roselee Nova, PA-C  atorvastatin (LIPITOR) 80 MG tablet Take 80 mg by mouth daily.    [provider]  fenofibrate 160 MG tablet Take 160 mg by mouth daily.    [provider]  HYDROcodone-acetaminophen (NORCO/VICODIN) 5-325 MG per tablet Take 1-2 tablets by mouth every 4 (four) hours as needed (breakthrough pain). 07/06/15   Roselee Nova, PA-C  lisinopril (PRINIVIL,ZESTRIL) 10 MG tablet Take 10 mg by mouth daily.    [provider]  methocarbamol (ROBAXIN) 500 MG  tablet Take 1 tablet (500 mg total) by mouth every 6 (six) hours as needed for muscle spasms. 07/06/15   Roselee Nova, PA-C    Family History History reviewed. No pertinent family history.  Social History Social History   Tobacco Use  . Smoking status: Never Smoker  . Smokeless tobacco: Never Used  Substance Use Topics  . Alcohol use: Yes    Alcohol/week: 7.0 standard drinks    Types: 7 Cans of beer per week    Comment: occasionally  . Drug use: No     Allergies   Patient has no known allergies.   Review of Systems Review of Systems  All other systems reviewed and are negative.    Physical Exam Updated Vital Signs BP (!) 157/89 (BP Location: Left Arm)   Pulse 75   Temp 98.5 F (36.9 C) (Oral)   Resp 16   Ht 6\' 2"  (1.88 m)   Wt 106.6 kg   SpO2 98%   BMI 30.17 kg/m   Physical Exam Vitals signs and nursing note reviewed.    63 year old male, resting comfortably and in no acute distress. Vital signs are significant for elevated blood pressure. Oxygen saturation is 98%, which is normal. Head is normocephalic.  There is a laceration above the right eyebrow. PERRLA, EOMI. Oropharynx is clear. Neck is nontender  and supple without adenopathy or JVD. Back is nontender and there is no CVA tenderness. Lungs are clear without rales, wheezes, or rhonchi. Chest is nontender. Heart has regular rate and rhythm without murmur. Abdomen is soft, flat, nontender without masses or hepatosplenomegaly and peristalsis is normoactive. Extremities have no cyanosis or edema, full range of motion is present.  Minor abrasion are seen in the distal right forearm and the right knee. Skin is warm and dry without rash. Neurologic: Mental status is normal, cranial nerves are intact, there are no motor or sensory deficits.  ED Treatments / Results   Procedures .Marland KitchenLaceration Repair  Date/Time: 04/24/2019 12:15 AM Performed by: Delora Fuel, MD Authorized by: Delora Fuel, MD    Consent:    Consent obtained:  Verbal   Consent given by:  Patient   Risks discussed:  Infection, pain and poor cosmetic result   Alternatives discussed:  No treatment Anesthesia (see MAR for exact dosages):    Anesthesia method:  Local infiltration   Local anesthetic:  Lidocaine 2% w/o epi Laceration details:    Location:  Face   Face location:  Forehead   Length (cm):  6   Depth (mm):  3 Repair type:    Repair type:  Simple Pre-procedure details:    Preparation:  Patient was prepped and draped in usual sterile fashion Exploration:    Hemostasis achieved with:  Direct pressure   Wound exploration: entire depth of wound probed and visualized     Wound extent: no foreign bodies/material noted     Contaminated: no   Treatment:    Area cleansed with:  Saline   Amount of cleaning:  Standard Skin repair:    Repair method:  Sutures   Suture size:  5-0   Suture material:  Nylon   Suture technique:  Simple interrupted   Number of sutures:  13 Approximation:    Approximation:  Close Post-procedure details:    Dressing:  Antibiotic ointment and non-adherent dressing   Patient tolerance of procedure:  Tolerated well, no immediate complications    Medications Ordered in ED Medications  lidocaine (XYLOCAINE) 2 % injection (has no administration in time range)  povidone-iodine (BETADINE) 10 % external solution (has no administration in time range)     Initial Impression / Assessment and Plan / ED Course  I have reviewed the triage vital signs and the nursing notes.  Fall at home with forehead laceration and minor abrasions.  No evidence of significant head injury, I feel that CT of head can be safely deferred and the signs of head injury appear.  He is not on any anticoagulants.  Laceration is closed with sutures.  Advised to have the sutures removed in 3-5 days.  Given head injury precautions and strict return instructions.  Old records are reviewed, and he has no relevant past  visits.  Final Clinical Impressions(s) / ED Diagnoses   Final diagnoses:  Fall from slip, trip, or stumble, initial encounter  Laceration of forehead, initial encounter  Abrasion, right knee, initial encounter  Abrasion of right forearm, initial encounter    ED Discharge Orders    None       Delora Fuel, MD 67/12/45 0017

## 2019-04-23 NOTE — ED Triage Notes (Signed)
Pt fell today. Tripped over his sandals. Wife states he was drinking. Has laceration to  Right brow bone. Small abrasion to right hand and knee.  noLOC

## 2019-04-23 NOTE — ED Notes (Signed)
Suture cart, lidocaine, betadine, and saline to room

## 2019-04-24 MED ORDER — BACITRACIN ZINC 500 UNIT/GM EX OINT
TOPICAL_OINTMENT | CUTANEOUS | Status: AC
  Administered 2019-04-24: 01:00:00
  Filled 2019-04-24: qty 0.9

## 2019-04-24 MED ORDER — DOUBLE ANTIBIOTIC 500-10000 UNIT/GM EX OINT: TOPICAL_OINTMENT | Freq: Once | CUTANEOUS | Status: DC

## 2019-04-24 NOTE — Discharge Instructions (Signed)
If signs of a head injury appear, then return to the emergency Department for re-evaluation.  Apply ice to sore areas as needed.  Take ibuprofen or acetaminophen as needed for pain.

## 2019-10-05 ENCOUNTER — Other Ambulatory Visit: Payer: Self-pay | Admitting: Urology

## 2019-10-18 ENCOUNTER — Other Ambulatory Visit: Payer: Self-pay | Admitting: Urology

## 2019-10-25 ENCOUNTER — Other Ambulatory Visit (HOSPITAL_COMMUNITY)
Admission: RE | Admit: 2019-10-25 | Discharge: 2019-10-25 | Disposition: A | Discharge status: Home / Self Care | Payer: BC Managed Care – PPO | Source: Ambulatory Visit | Attending: Urology | Admitting: Urology

## 2019-10-25 ENCOUNTER — Other Ambulatory Visit: Payer: Self-pay

## 2019-10-25 DIAGNOSIS — Z01812 Encounter for preprocedural laboratory examination: Secondary | ICD-10-CM | POA: Diagnosis present

## 2019-10-25 DIAGNOSIS — Z20828 Contact with and (suspected) exposure to other viral communicable diseases: Secondary | ICD-10-CM | POA: Insufficient documentation

## 2019-10-25 LAB — SARS CORONAVIRUS 2 (TAT 6-24 HRS): SARS Coronavirus 2: NEGATIVE

## 2019-10-26 ENCOUNTER — Encounter (HOSPITAL_BASED_OUTPATIENT_CLINIC_OR_DEPARTMENT_OTHER): Payer: Self-pay | Admitting: Urology

## 2019-10-26 ENCOUNTER — Other Ambulatory Visit: Payer: Self-pay

## 2019-10-26 NOTE — Progress Notes (Signed)
Spoke w/ via phone for pre-op interview---Timothy Rich needs dos----    I stat 8          Rich results------records on chart/epic: echo stress dr zagol 05-27-19, ekg 05-06-19 dr Sondra Come, lov dr zagol cardiology  05-06-19 COVID test ------10-25-2019 Arrive at -------1030 am 10-28-2019 NPO after ------midnight Medications to take morning of surgery -----amlodipine, omeprazole Diabetic medication -----n/a Patient Special Instructions ----- Pre-Op special Istructions ----- Patient verbalized understanding of instructions that were given at this phone interview. Patient denies shortness of breath, chest pain, fever, cough a this phone interview.

## 2019-10-28 ENCOUNTER — Encounter (HOSPITAL_BASED_OUTPATIENT_CLINIC_OR_DEPARTMENT_OTHER)
Admission: RE | Disposition: A | Discharge status: Home / Self Care | Payer: Self-pay | Source: Home / Self Care | Attending: Urology

## 2019-10-28 ENCOUNTER — Observation Stay (HOSPITAL_BASED_OUTPATIENT_CLINIC_OR_DEPARTMENT_OTHER)
Admission: RE | Admit: 2019-10-28 | Discharge: 2019-10-29 | Disposition: A | Discharge status: Home / Self Care | Payer: BC Managed Care – PPO | Attending: Urology | Admitting: Urology

## 2019-10-28 ENCOUNTER — Ambulatory Visit (HOSPITAL_BASED_OUTPATIENT_CLINIC_OR_DEPARTMENT_OTHER): Payer: BC Managed Care – PPO | Admitting: Anesthesiology

## 2019-10-28 ENCOUNTER — Other Ambulatory Visit: Payer: Self-pay

## 2019-10-28 ENCOUNTER — Encounter (HOSPITAL_BASED_OUTPATIENT_CLINIC_OR_DEPARTMENT_OTHER): Payer: Self-pay | Admitting: Urology

## 2019-10-28 DIAGNOSIS — Z7982 Long term (current) use of aspirin: Secondary | ICD-10-CM | POA: Insufficient documentation

## 2019-10-28 DIAGNOSIS — Z96641 Presence of right artificial hip joint: Secondary | ICD-10-CM | POA: Insufficient documentation

## 2019-10-28 DIAGNOSIS — R338 Other retention of urine: Secondary | ICD-10-CM | POA: Insufficient documentation

## 2019-10-28 DIAGNOSIS — N400 Enlarged prostate without lower urinary tract symptoms: Secondary | ICD-10-CM | POA: Diagnosis present

## 2019-10-28 DIAGNOSIS — E669 Obesity, unspecified: Secondary | ICD-10-CM | POA: Insufficient documentation

## 2019-10-28 DIAGNOSIS — Z6830 Body mass index (BMI) 30.0-30.9, adult: Secondary | ICD-10-CM | POA: Insufficient documentation

## 2019-10-28 DIAGNOSIS — I10 Essential (primary) hypertension: Secondary | ICD-10-CM | POA: Diagnosis not present

## 2019-10-28 DIAGNOSIS — E785 Hyperlipidemia, unspecified: Secondary | ICD-10-CM | POA: Insufficient documentation

## 2019-10-28 DIAGNOSIS — E7800 Pure hypercholesterolemia, unspecified: Secondary | ICD-10-CM | POA: Insufficient documentation

## 2019-10-28 DIAGNOSIS — N138 Other obstructive and reflux uropathy: Secondary | ICD-10-CM

## 2019-10-28 DIAGNOSIS — Z79899 Other long term (current) drug therapy: Secondary | ICD-10-CM | POA: Diagnosis not present

## 2019-10-28 DIAGNOSIS — N401 Enlarged prostate with lower urinary tract symptoms: Secondary | ICD-10-CM | POA: Diagnosis not present

## 2019-10-28 HISTORY — DX: Other complications of anesthesia, initial encounter: T88.59XA

## 2019-10-28 HISTORY — DX: Benign prostatic hyperplasia without lower urinary tract symptoms: N40.0

## 2019-10-28 HISTORY — PX: TRANSURETHRAL RESECTION OF PROSTATE: SHX73

## 2019-10-28 LAB — POCT I-STAT, CHEM 8
BUN: 38 mg/dL — ABNORMAL HIGH (ref 8–23)
Calcium, Ion: 1.26 mmol/L (ref 1.15–1.40)
Chloride: 103 mmol/L (ref 98–111)
Creatinine, Ser: 1.9 mg/dL — ABNORMAL HIGH (ref 0.61–1.24)
Glucose, Bld: 93 mg/dL (ref 70–99)
HCT: 42 % (ref 39.0–52.0)
Hemoglobin: 14.3 g/dL (ref 13.0–17.0)
Potassium: 4.9 mmol/L (ref 3.5–5.1)
Sodium: 137 mmol/L (ref 135–145)
TCO2: 28 mmol/L (ref 22–32)

## 2019-10-28 LAB — CBC
HCT: 38.4 % — ABNORMAL LOW (ref 39.0–52.0)
Hemoglobin: 12.2 g/dL — ABNORMAL LOW (ref 13.0–17.0)
MCH: 30.5 pg (ref 26.0–34.0)
MCHC: 31.8 g/dL (ref 30.0–36.0)
MCV: 96 fL (ref 80.0–100.0)
Platelets: 289 10*3/uL (ref 150–400)
RBC: 4 MIL/uL — ABNORMAL LOW (ref 4.22–5.81)
RDW: 13.6 % (ref 11.5–15.5)
WBC: 15.2 10*3/uL — ABNORMAL HIGH (ref 4.0–10.5)
nRBC: 0 % (ref 0.0–0.2)

## 2019-10-28 LAB — BASIC METABOLIC PANEL
Anion gap: 10 (ref 5–15)
BUN: 31 mg/dL — ABNORMAL HIGH (ref 8–23)
CO2: 23 mmol/L (ref 22–32)
Calcium: 8.6 mg/dL — ABNORMAL LOW (ref 8.9–10.3)
Chloride: 107 mmol/L (ref 98–111)
Creatinine, Ser: 1.73 mg/dL — ABNORMAL HIGH (ref 0.61–1.24)
GFR calc Af Amer: 48 mL/min — ABNORMAL LOW (ref >60)
GFR calc non Af Amer: 41 mL/min — ABNORMAL LOW (ref >60)
Glucose, Bld: 101 mg/dL — ABNORMAL HIGH (ref 70–99)
Potassium: 4.6 mmol/L (ref 3.5–5.1)
Sodium: 140 mmol/L (ref 135–145)

## 2019-10-28 SURGERY — TURP (TRANSURETHRAL RESECTION OF PROSTATE)
Anesthesia: General | Site: Prostate

## 2019-10-28 MED ORDER — SODIUM CHLORIDE 0.9 % IV SOLN
2.0000 g | INTRAVENOUS | Status: AC
  Administered 2019-10-28: 2 g via INTRAVENOUS
  Filled 2019-10-28: qty 20

## 2019-10-28 MED ORDER — HYDROMORPHONE HCL 1 MG/ML IJ SOLN
0.5000 mg | INTRAMUSCULAR | Status: DC | PRN
  Filled 2019-10-28: qty 1

## 2019-10-28 MED ORDER — ATORVASTATIN CALCIUM 80 MG PO TABS
80.0000 mg | ORAL_TABLET | Freq: Every day | ORAL | Status: DC
  Administered 2019-10-28: 80 mg via ORAL
  Filled 2019-10-28 (×2): qty 1

## 2019-10-28 MED ORDER — LISINOPRIL-HYDROCHLOROTHIAZIDE 20-12.5 MG PO TABS: 1.0000 | ORAL_TABLET | Freq: Every day | ORAL | Status: DC

## 2019-10-28 MED ORDER — DIPHENHYDRAMINE HCL 50 MG/ML IJ SOLN
12.5000 mg | Freq: Four times a day (QID) | INTRAMUSCULAR | Status: DC | PRN
  Filled 2019-10-28: qty 0.5

## 2019-10-28 MED ORDER — ONDANSETRON HCL 4 MG/2ML IJ SOLN
INTRAMUSCULAR | Status: DC | PRN
  Administered 2019-10-28: 4 mg via INTRAVENOUS

## 2019-10-28 MED ORDER — FENTANYL CITRATE (PF) 100 MCG/2ML IJ SOLN
INTRAMUSCULAR | Status: AC
  Filled 2019-10-28: qty 2

## 2019-10-28 MED ORDER — SODIUM CHLORIDE 0.9 % IR SOLN
3000.0000 mL | Status: DC
  Administered 2019-10-28 – 2019-10-29 (×2): 3000 mL
  Filled 2019-10-28: qty 3000

## 2019-10-28 MED ORDER — OXYCODONE-ACETAMINOPHEN 5-325 MG PO TABS
1.0000 | ORAL_TABLET | ORAL | Status: DC | PRN
  Filled 2019-10-28: qty 2

## 2019-10-28 MED ORDER — CEFTRIAXONE SODIUM 2 G IJ SOLR
INTRAMUSCULAR | Status: AC
  Filled 2019-10-28: qty 20

## 2019-10-28 MED ORDER — FENTANYL CITRATE (PF) 100 MCG/2ML IJ SOLN
INTRAMUSCULAR | Status: DC | PRN
  Administered 2019-10-28: 25 ug via INTRAVENOUS
  Administered 2019-10-28: 50 ug via INTRAVENOUS
  Administered 2019-10-28: 25 ug via INTRAVENOUS

## 2019-10-28 MED ORDER — ONDANSETRON HCL 4 MG/2ML IJ SOLN
4.0000 mg | Freq: Once | INTRAMUSCULAR | Status: DC | PRN
  Filled 2019-10-28: qty 2

## 2019-10-28 MED ORDER — ONDANSETRON HCL 4 MG/2ML IJ SOLN
4.0000 mg | INTRAMUSCULAR | Status: DC | PRN
  Filled 2019-10-28: qty 2

## 2019-10-28 MED ORDER — ASPIRIN 81 MG PO TBEC
81.0000 mg | DELAYED_RELEASE_TABLET | ORAL | Status: DC
  Filled 2019-10-28: qty 1

## 2019-10-28 MED ORDER — SODIUM CHLORIDE 0.9 % IV SOLN
INTRAVENOUS | Status: DC
  Filled 2019-10-28 (×2): qty 1000

## 2019-10-28 MED ORDER — ACETAMINOPHEN 325 MG PO TABS
650.0000 mg | ORAL_TABLET | ORAL | Status: DC | PRN
  Filled 2019-10-28: qty 2

## 2019-10-28 MED ORDER — LIDOCAINE 2% (20 MG/ML) 5 ML SYRINGE
INTRAMUSCULAR | Status: AC
  Filled 2019-10-28: qty 5

## 2019-10-28 MED ORDER — SODIUM CHLORIDE 0.9 % IV SOLN
INTRAVENOUS | Status: AC
  Filled 2019-10-28: qty 100

## 2019-10-28 MED ORDER — LIDOCAINE 2% (20 MG/ML) 5 ML SYRINGE
INTRAMUSCULAR | Status: DC | PRN
  Administered 2019-10-28: 80 mg via INTRAVENOUS

## 2019-10-28 MED ORDER — SODIUM CHLORIDE 0.9 % IR SOLN
Status: DC | PRN
  Administered 2019-10-28: 3000 mL
  Administered 2019-10-28 (×3): 6000 mL

## 2019-10-28 MED ORDER — DEXAMETHASONE SODIUM PHOSPHATE 10 MG/ML IJ SOLN
INTRAMUSCULAR | Status: AC
  Filled 2019-10-28: qty 1

## 2019-10-28 MED ORDER — PROPOFOL 10 MG/ML IV BOLUS
INTRAVENOUS | Status: DC | PRN
  Administered 2019-10-28: 200 mg via INTRAVENOUS

## 2019-10-28 MED ORDER — MEPERIDINE HCL 25 MG/ML IJ SOLN
6.2500 mg | INTRAMUSCULAR | Status: DC | PRN
  Filled 2019-10-28: qty 1

## 2019-10-28 MED ORDER — ZOLPIDEM TARTRATE 5 MG PO TABS
ORAL_TABLET | ORAL | Status: AC
  Filled 2019-10-28: qty 1

## 2019-10-28 MED ORDER — OXYCODONE HCL 5 MG PO TABS
5.0000 mg | ORAL_TABLET | Freq: Once | ORAL | Status: DC | PRN
  Filled 2019-10-28: qty 1

## 2019-10-28 MED ORDER — ONDANSETRON HCL 4 MG/2ML IJ SOLN
INTRAMUSCULAR | Status: AC
  Filled 2019-10-28: qty 2

## 2019-10-28 MED ORDER — MIDAZOLAM HCL 5 MG/5ML IJ SOLN
INTRAMUSCULAR | Status: DC | PRN
  Administered 2019-10-28: 2 mg via INTRAVENOUS

## 2019-10-28 MED ORDER — ZOLPIDEM TARTRATE 5 MG PO TABS
5.0000 mg | ORAL_TABLET | Freq: Every evening | ORAL | Status: DC | PRN
  Administered 2019-10-28 – 2019-10-29 (×2): 5 mg via ORAL
  Filled 2019-10-28: qty 1

## 2019-10-28 MED ORDER — FENOFIBRATE 160 MG PO TABS
160.0000 mg | ORAL_TABLET | Freq: Every day | ORAL | Status: DC
  Administered 2019-10-28: 160 mg via ORAL
  Filled 2019-10-28 (×2): qty 1

## 2019-10-28 MED ORDER — SODIUM CHLORIDE 0.9 % IV SOLN
INTRAVENOUS | Status: DC
  Filled 2019-10-28: qty 1000

## 2019-10-28 MED ORDER — DIPHENHYDRAMINE HCL 12.5 MG/5ML PO ELIX
12.5000 mg | ORAL_SOLUTION | Freq: Four times a day (QID) | ORAL | Status: DC | PRN
  Filled 2019-10-28: qty 10

## 2019-10-28 MED ORDER — FENTANYL CITRATE (PF) 100 MCG/2ML IJ SOLN
25.0000 ug | INTRAMUSCULAR | Status: DC | PRN
  Filled 2019-10-28: qty 1

## 2019-10-28 MED ORDER — OXYCODONE HCL 5 MG/5ML PO SOLN
5.0000 mg | Freq: Once | ORAL | Status: DC | PRN
  Filled 2019-10-28: qty 5

## 2019-10-28 MED ORDER — MIDAZOLAM HCL 2 MG/2ML IJ SOLN
INTRAMUSCULAR | Status: AC
  Filled 2019-10-28: qty 2

## 2019-10-28 MED ORDER — PROPOFOL 10 MG/ML IV BOLUS
INTRAVENOUS | Status: AC
  Filled 2019-10-28: qty 20

## 2019-10-28 MED ORDER — DEXAMETHASONE SODIUM PHOSPHATE 10 MG/ML IJ SOLN
INTRAMUSCULAR | Status: DC | PRN
  Administered 2019-10-28: 10 mg via INTRAVENOUS

## 2019-10-28 SURGICAL SUPPLY — 25 items
BAG DRAIN URO-CYSTO SKYTR STRL (DRAIN) ×3 IMPLANT
BAG URINE DRAIN 2000ML AR STRL (UROLOGICAL SUPPLIES) ×3 IMPLANT
CATH FOLEY 3WAY 30CC 22F (CATHETERS) ×3 IMPLANT
CATH FOLEY 3WAY 30CC 22FR (CATHETERS) ×2 IMPLANT
CLOTH BEACON ORANGE TIMEOUT ST (SAFETY) ×3 IMPLANT
GLOVE BIO SURGEON STRL SZ7.5 (GLOVE) ×2 IMPLANT
GLOVE BIO SURGEON STRL SZ8 (GLOVE) ×3 IMPLANT
GLOVE BIOGEL PI IND STRL 7.0 (GLOVE) IMPLANT
GLOVE BIOGEL PI IND STRL 7.5 (GLOVE) IMPLANT
GLOVE BIOGEL PI INDICATOR 7.0 (GLOVE) ×2
GLOVE BIOGEL PI INDICATOR 7.5 (GLOVE) ×2
GOWN STRL REUS W/TWL LRG LVL3 (GOWN DISPOSABLE) ×2 IMPLANT
GOWN STRL REUS W/TWL XL LVL3 (GOWN DISPOSABLE) ×3 IMPLANT
HOLDER FOLEY CATH W/STRAP (MISCELLANEOUS) ×3 IMPLANT
IV NS IRRIG 3000ML ARTHROMATIC (IV SOLUTION) ×14 IMPLANT
KIT TURNOVER CYSTO (KITS) ×3 IMPLANT
LOOP CUT BIPOLAR 24F LRG (ELECTROSURGICAL) ×4 IMPLANT
MANIFOLD NEPTUNE II (INSTRUMENTS) ×3 IMPLANT
NS IRRIG 500ML POUR BTL (IV SOLUTION) ×2 IMPLANT
PACK CYSTO (CUSTOM PROCEDURE TRAY) ×3 IMPLANT
PLUG CATH AND CAP STER (CATHETERS) ×3 IMPLANT
SYR 30ML LL (SYRINGE) ×3 IMPLANT
TUBE CONNECTING 12'X1/4 (SUCTIONS) ×1
TUBE CONNECTING 12X1/4 (SUCTIONS) ×1 IMPLANT
TUBING UROLOGY SET (TUBING) ×3 IMPLANT

## 2019-10-28 NOTE — Anesthesia Postprocedure Evaluation (Signed)
Anesthesia Post Note  Patient: Timothy Rich  Procedure(s) Performed: TRANSURETHRAL RESECTION OF THE PROSTATE (TURP) (N/A Prostate)     Patient location during evaluation: PACU Anesthesia Type: General Level of consciousness: awake and alert and oriented Pain management: pain level controlled Vital Signs Assessment: post-procedure vital signs reviewed and stable Respiratory status: spontaneous breathing, nonlabored ventilation and respiratory function stable Cardiovascular status: blood pressure returned to baseline and stable Postop Assessment: no apparent nausea or vomiting Anesthetic complications: no    Last Vitals:  Vitals:   10/28/19 1544 10/28/19 1707  BP: 139/78 134/75  Pulse: 80 83  Resp: 14 16  Temp: 36.9 C   SpO2: 99% 97%    Last Pain:  Vitals:   10/28/19 1707  TempSrc: Oral  PainSc:                  Catlin Doria A.

## 2019-10-28 NOTE — H&P (Signed)
Urology Admission H&P  Chief Complaint: urinary retention  History of Present Illness: Mr Timothy Rich is a 63yo with a hx of BPH and urinary retention. He has an indwelling foley. He had 2.5L in his bladder at the time of foley placement  Past Medical History:  Diagnosis Date  . Arthritis   . BPH (benign prostatic hyperplasia)   . Complication of anesthesia    low bp with hx replaced 2016  . High cholesterol   . Hypertension    Past Surgical History:  Procedure Laterality Date  . APPENDECTOMY    . COLON SURGERY    . KNEE ARTHROSCOPY     left knee  . pilonidal cysts     as a teen ager  . TOTAL HIP ARTHROPLASTY Right 07/04/2015   Procedure: TOTAL HIP ARTHROPLASTY ANTERIOR APPROACH;  Surgeon: Melrose Nakayama, MD;  Location: [facility];  Service: Orthopedics;  Laterality: Right;  Marland Kitchen VASECTOMY      Home Medications:  Current Facility-Administered Medications  Medication Dose Route Frequency Provider Last Rate Last Admin  . 0.9 %  sodium chloride infusion   Intravenous Continuous Albertha Ghee, MD 50 mL/hr at 10/28/19 1030 Restarted at 10/28/19 1209  . cefTRIAXone (ROCEPHIN) 2 g in sodium chloride 0.9 % 100 mL IVPB  2 g Intravenous 30 min Pre-Op Beronica Lansdale, Candee Furbish, MD       Allergies: No Known Allergies  History reviewed. No pertinent family history. Social History:  reports that he has never smoked. He has never used smokeless tobacco. He reports current alcohol use of about 7.0 standard drinks of alcohol per week. He reports that he does not use drugs.  Review of Systems  All other systems reviewed and are negative.   Physical Exam:  Vital signs in last 24 hours: Temp:  [97.8 F (36.6 C)] 97.8 F (36.6 C) (12/17 1051) Pulse Rate:  [85] 85 (12/17 1051) Resp:  [16] 16 (12/17 1051) BP: (142)/(88) 142/88 (12/17 1051) SpO2:  [98 %] 98 % (12/17 1051) Weight:  [108.9 kg] 108.9 kg (12/17 1051) Physical Exam  Constitutional: He is oriented to person, place, and time. He appears  well-developed and well-nourished.  HENT:  Head: Normocephalic and atraumatic.  Eyes: Pupils are equal, round, and reactive to light. EOM are normal.  Neck: No thyromegaly present.  Cardiovascular: Normal rate and regular rhythm.  Respiratory: Effort normal. No respiratory distress.  GI: Soft. He exhibits no distension.  Musculoskeletal:        General: No edema. Normal range of motion.     Cervical back: Normal range of motion.  Neurological: He is alert and oriented to person, place, and time.  Skin: Skin is warm and dry.  Psychiatric: He has a normal mood and affect. His behavior is normal. Judgment and thought content normal.    Laboratory Data:  Results for orders placed or performed during the hospital encounter of 10/28/19 (from the past 24 hour(s))  I-STAT, chem 8     Status: Abnormal   Collection Time: 10/28/19 10:45 AM  Result Value Ref Range   Sodium 137 135 - 145 mmol/L   Potassium 4.9 3.5 - 5.1 mmol/L   Chloride 103 98 - 111 mmol/L   BUN 38 (H) 8 - 23 mg/dL   Creatinine, Ser 1.90 (H) 0.61 - 1.24 mg/dL   Glucose, Bld 93 70 - 99 mg/dL   Calcium, Ion 1.26 1.15 - 1.40 mmol/L   TCO2 28 22 - 32 mmol/L   Hemoglobin 14.3 13.0 - 17.0  g/dL   HCT 42.0 39.0 - 52.0 %   Recent Results (from the past 240 hour(s))  SARS CORONAVIRUS 2 (TAT 6-24 HRS) Nasopharyngeal Nasopharyngeal Swab     Status: None   Collection Time: 10/25/19  6:53 AM   Specimen: Nasopharyngeal Swab  Result Value Ref Range Status   SARS Coronavirus 2 NEGATIVE NEGATIVE Final    Comment: (NOTE) SARS-CoV-2 target nucleic acids are NOT DETECTED. The SARS-CoV-2 RNA is generally detectable in upper and lower respiratory specimens during the acute phase of infection. Negative results do not preclude SARS-CoV-2 infection, do not rule out co-infections with other pathogens, and should not be used as the sole basis for treatment or other patient management decisions. Negative results must be combined with clinical  observations, patient history, and epidemiological information. The expected result is Negative. Fact Sheet for Patients: SugarRoll.be Fact Sheet for Healthcare Providers: https://www.woods-mathews.com/ This test is not yet approved or cleared by the [place] FDA and  has been authorized for detection and/or diagnosis of SARS-CoV-2 by FDA under an Emergency Use Authorization (EUA). This EUA will remain  in effect (meaning this test can be used) for the duration of the COVID-19 declaration under Section 56 4(b)(1) of the Act, 21 U.S.C. section 360bbb-3(b)(1), unless the authorization is terminated or revoked sooner. Performed at [facility], [place] [address], [place], [place] [address]    Creatinine: Recent Labs    10/28/19 1045  CREATININE 1.90*   Baseline Creatinine: 1.3  Impression/Assessment:  63yo with BPH with urinary retention  Plan:  The risks/benefits/alternatives to TURP was explained to the patient and he understands and wishes to proceed with surgery  Nicolette Bang 10/28/2019, 12:23 PM

## 2019-10-28 NOTE — OR Nursing (Signed)
Foley catheter was removed, 50 cc urine output.

## 2019-10-28 NOTE — Op Note (Signed)
Preoperative diagnosis: BPH with urinary retention  Postoperative diagnosis: BPH  Procedure: 1 cystoscopy 2. Transurethral resection of the prostate  Attending: Nicolette Bang  Anesthesia: General  Estimated blood loss: Minimal  Drains: 22 French foley  Specimens: 1. Prostate Chips  Antibiotics: Rocephin  Findings: Trilobar prostate enlargement. Ureteral orifices in normal anatomic location.   Indications: Patient is a 63 year old male with a history of BPH with urinary retention.  After discussing treatment options, they decided proceed with transurethral resection of the prostate.  Procedure her in detail: The patient was brought to the operating room and a brief timeout was done to ensure correct patient, correct procedure, correct site.  General anesthesia was administered patient was placed in dorsal lithotomy position.  Their genitalia was then prepped and draped in usual sterile fashion.  A rigid 69 French cystoscope was passed in the urethra and the bladder.  Bladder was inspected and we noted no masses or lesions.  the ureteral orifices were in the normal orthotopic locations. removed the cystoscope and placed a resectoscope into the bladder. We then turned our attention to the prostate resection. Using the bipolar resectoscope we resected the median lobe first from the bladder neck to the verumontanum. We then started at the 12 oclock position on the left lobe and resection to the 6 o'clock position from the bladder neck to the verumontanum. We then did the same resection of the right lobe. Once the resection was complete we then cauterized individual bleeders. We then removed the prostate chips and sent them for pathology.  We then re-inspected the prostatic fossa and found no residual bleeding.  the bladder was then drained, a 22 French foley was placed and this concluded the procedure which was well tolerated by patient.  Complications: None  Condition: Stable, extubated,  transferred to PACU  Plan: Patient is admitted overnight with continuous bladder irrigation. If their urine is clear tomorrow they will be discharged home and followup in 5 days for foley catheter removal and pathology discussion.

## 2019-10-28 NOTE — Anesthesia Preprocedure Evaluation (Signed)
Anesthesia Evaluation  Patient identified by MRN, date of birth, ID band Patient awake    Reviewed: Allergy & Precautions, NPO status , Patient's Chart, lab work & pertinent test results  History of Anesthesia Complications (+) history of anesthetic complications  Airway Mallampati: II  TM Distance: >3 FB Neck ROM: Full    Dental  (+) Teeth Intact, Caps   Pulmonary neg pulmonary ROS,    Pulmonary exam normal breath sounds clear to auscultation       Cardiovascular hypertension, Pt. on medications Normal cardiovascular exam Rhythm:Regular Rate:Normal     Neuro/Psych negative neurological ROS  negative psych ROS   GI/Hepatic negative GI ROS, Neg liver ROS,   Endo/Other  Obesity Hyperlipidemia  Renal/GU negative Renal ROS   BPH    Musculoskeletal  (+) Arthritis , Osteoarthritis,    Abdominal   Peds  Hematology negative hematology ROS (+)   Anesthesia Other Findings   Reproductive/Obstetrics                             Anesthesia Physical Anesthesia Plan  ASA: II  Anesthesia Plan: General   Post-op Pain Management:    Induction: Intravenous  PONV Risk Score and Plan: 3 and Ondansetron, Dexamethasone and Treatment may vary due to age or medical condition  Airway Management Planned: LMA  Additional Equipment:   Intra-op Plan:   Post-operative Plan: Extubation in OR  Informed Consent: I have reviewed the patients History and Physical, chart, labs and discussed the procedure including the risks, benefits and alternatives for the proposed anesthesia with the patient or authorized representative who has indicated his/her understanding and acceptance.     Dental advisory given  Plan Discussed with: CRNA and Surgeon  Anesthesia Plan Comments:         Anesthesia Quick Evaluation

## 2019-10-28 NOTE — Anesthesia Procedure Notes (Signed)
Procedure Name: LMA Insertion Date/Time: 10/28/2019 12:43 PM Performed by: Rashelle Ireland D, CRNA Pre-anesthesia Checklist: Patient identified, Emergency Drugs available, Suction available and Patient being monitored Patient Re-evaluated:Patient Re-evaluated prior to induction Oxygen Delivery Method: Circle system utilized Preoxygenation: Pre-oxygenation with 100% oxygen Induction Type: IV induction Ventilation: Mask ventilation without difficulty LMA: LMA inserted LMA Size: 4.0 Tube type: Oral Number of attempts: 1 Placement Confirmation: positive ETCO2 and breath sounds checked- equal and bilateral Tube secured with: Tape Dental Injury: Teeth and Oropharynx as per pre-operative assessment

## 2019-10-28 NOTE — Transfer of Care (Signed)
Immediate Anesthesia Transfer of Care Note  Patient: Timothy Rich  Procedure(s) Performed: TRANSURETHRAL RESECTION OF THE PROSTATE (TURP) (N/A Prostate)  Patient Location: PACU  Anesthesia Type:General  Level of Consciousness: awake, alert  and oriented  Airway & Oxygen Therapy: Patient Spontanous Breathing and Patient connected to nasal cannula oxygen  Post-op Assessment: Report given to RN and Post -op Vital signs reviewed and stable  Post vital signs: Reviewed and stable  Last Vitals:  Vitals Value Taken Time  BP 123/78 10/28/19 1357  Temp    Pulse 83 10/28/19 1358  Resp 18 10/28/19 1358  SpO2 99 % 10/28/19 1358  Vitals shown include unvalidated device data.  Last Pain: There were no vitals filed for this visit.       Complications: No apparent anesthesia complications

## 2019-10-29 DIAGNOSIS — N401 Enlarged prostate with lower urinary tract symptoms: Secondary | ICD-10-CM | POA: Diagnosis not present

## 2019-10-29 LAB — CBC
HCT: 33.9 % — ABNORMAL LOW (ref 39.0–52.0)
Hemoglobin: 10.9 g/dL — ABNORMAL LOW (ref 13.0–17.0)
MCH: 31.2 pg (ref 26.0–34.0)
MCHC: 32.2 g/dL (ref 30.0–36.0)
MCV: 97.1 fL (ref 80.0–100.0)
Platelets: 252 10*3/uL (ref 150–400)
RBC: 3.49 MIL/uL — ABNORMAL LOW (ref 4.22–5.81)
RDW: 13.8 % (ref 11.5–15.5)
WBC: 15.3 10*3/uL — ABNORMAL HIGH (ref 4.0–10.5)
nRBC: 0 % (ref 0.0–0.2)

## 2019-10-29 LAB — BASIC METABOLIC PANEL
Anion gap: 9 (ref 5–15)
BUN: 27 mg/dL — ABNORMAL HIGH (ref 8–23)
CO2: 21 mmol/L — ABNORMAL LOW (ref 22–32)
Calcium: 8.4 mg/dL — ABNORMAL LOW (ref 8.9–10.3)
Chloride: 108 mmol/L (ref 98–111)
Creatinine, Ser: 1.6 mg/dL — ABNORMAL HIGH (ref 0.61–1.24)
GFR calc Af Amer: 52 mL/min — ABNORMAL LOW (ref >60)
GFR calc non Af Amer: 45 mL/min — ABNORMAL LOW (ref >60)
Glucose, Bld: 122 mg/dL — ABNORMAL HIGH (ref 70–99)
Potassium: 4.2 mmol/L (ref 3.5–5.1)
Sodium: 138 mmol/L (ref 135–145)

## 2019-10-29 LAB — SURGICAL PATHOLOGY

## 2019-10-29 MED ORDER — OXYCODONE-ACETAMINOPHEN 5-325 MG PO TABS: 1.0000 | ORAL_TABLET | ORAL | 0 refills | Status: DC | PRN

## 2019-10-29 MED ORDER — FINASTERIDE 5 MG PO TABS: 5.0000 mg | ORAL_TABLET | Freq: Every day | ORAL | 3 refills | Status: DC

## 2019-10-29 MED ORDER — ZOLPIDEM TARTRATE 5 MG PO TABS
ORAL_TABLET | ORAL | Status: AC
  Filled 2019-10-29: qty 1

## 2019-10-29 NOTE — Discharge Instructions (Signed)
Indwelling Urinary Catheter Care, Adult An indwelling urinary catheter is a thin tube that is put into your bladder. The tube helps to drain pee (urine) out of your body. The tube goes in through your urethra. Your urethra is where pee comes out of your body. Your pee will come out through the catheter, then it will go into a bag (drainage bag). Take good care of your catheter so it will work well. How to wear your catheter and bag Supplies needed  Sticky tape (adhesive tape) or a leg strap.  Alcohol wipe or soap and water (if you use tape).  A clean towel (if you use tape).  Large overnight bag.  Smaller bag (leg bag). Wearing your catheter Attach your catheter to your leg with tape or a leg strap.  Make sure the catheter is not pulled tight.  If a leg strap gets wet, take it off and put on a dry strap.  If you use tape to hold the bag on your leg: 1. Use an alcohol wipe or soap and water to wash your skin where the tape made it sticky before. 2. Use a clean towel to pat-dry that skin. 3. Use new tape to make the bag stay on your leg. Wearing your bags You should have been given a large overnight bag.  You may wear the overnight bag in the day or night.  Always have the overnight bag lower than your bladder.  Do not let the bag touch the floor.  Before you go to sleep, put a clean plastic bag in a wastebasket. Then hang the overnight bag inside the wastebasket. You should also have a smaller leg bag that fits under your clothes.  Always wear the leg bag below your knee.  Do not wear your leg bag at night. How to care for your skin and catheter Supplies needed  A clean washcloth.  Water and mild soap.  A clean towel. Caring for your skin and catheter      Clean the skin around your catheter every day: ? Wash your hands with soap and water. ? Wet a clean washcloth in warm water and mild soap. ? Clean the skin around your urethra. ? If you are male: ? Gently  spread the folds of skin around your vagina (labia). ? With the washcloth in your other hand, wipe the inner side of your labia on each side. Wipe from front to back. ? If you are male: ? Pull back any skin that covers the end of your penis (foreskin). ? With the washcloth in your other hand, wipe your penis in small circles. Start wiping at the tip of your penis, then move away from the catheter. ? Move the foreskin back in place, if needed. ? With your free hand, hold the catheter close to where it goes into your body. ? Keep holding the catheter during cleaning so it does not get pulled out. ? With the washcloth in your other hand, clean the catheter. ? Only wipe downward on the catheter. ? Do not wipe upward toward your body. Doing this may push germs into your urethra and cause infection. ? Use a clean towel to pat-dry the catheter and the skin around it. Make sure to wipe off all soap. ? Wash your hands with soap and water.  Shower every day. Do not take baths.  Do not use cream, ointment, or lotion on the area where the catheter goes into your body, unless your doctor tells you  to.  Do not use powders, sprays, or lotions on your genital area.  Check your skin around the catheter every day for signs of infection. Check for: ? Redness, swelling, or pain. ? Fluid or blood. ? Warmth. ? Pus or a bad smell. How to empty the bag Supplies needed  Rubbing alcohol.  Gauze pad or cotton ball.  Tape or a leg strap. Emptying the bag Pour the pee out of your bag when it is ?- full, or at least 2-3 times a day. Do this for your overnight bag and your leg bag. 1. Wash your hands with soap and water. 2. Separate (detach) the bag from your leg. 3. Hold the bag over the toilet or a clean pail. Keep the bag lower than your hips and bladder. This is so the pee (urine) does not go back into the tube. 4. Open the pour spout. It is at the bottom of the bag. 5. Empty the pee into the toilet or  pail. Do not let the pour spout touch any surface. 6. Put rubbing alcohol on a gauze pad or cotton ball. 7. Use the gauze pad or cotton ball to clean the pour spout. 8. Close the pour spout. 9. Attach the bag to your leg with tape or a leg strap. 10. Wash your hands with soap and water. Follow instructions for cleaning the drainage bag:  From the product maker.  As told by your doctor. How to change the bag Supplies needed  Alcohol wipes.  A clean bag.  Tape or a leg strap. Changing the bag Replace your bag when it starts to leak, smell bad, or look dirty. 1. Wash your hands with soap and water. 2. Separate the dirty bag from your leg. 3. Pinch the catheter with your fingers so that pee does not spill out. 4. Separate the catheter tube from the bag tube where these tubes connect (at the connection valve). Do not let the tubes touch any surface. 5. Clean the end of the catheter tube with an alcohol wipe. Use a different alcohol wipe to clean the end of the bag tube. 6. Connect the catheter tube to the tube of the clean bag. 7. Attach the clean bag to your leg with tape or a leg strap. Do not make the bag tight on your leg. 8. Wash your hands with soap and water. General rules   Never pull on your catheter. Never try to take it out. Doing that can hurt you.  Always wash your hands before and after you touch your catheter or bag. Use a mild, fragrance-free soap. If you do not have soap and water, use hand sanitizer.  Always make sure there are no twists or bends (kinks) in the catheter tube.  Always make sure there are no leaks in the catheter or bag.  Drink enough fluid to keep your pee pale yellow.  Do not take baths, swim, or use a hot tub.  If you are male, wipe from front to back after you poop (have a bowel movement). Contact a doctor if:  Your pee is cloudy.  Your pee smells worse than usual.  Your catheter gets clogged.  Your catheter leaks.  Your bladder  feels full. Get help right away if:  You have redness, swelling, or pain where the catheter goes into your body.  You have fluid, blood, pus, or a bad smell coming from the area where the catheter goes into your body.  Your skin feels warm where  the catheter goes into your body.  You have a fever.  You have pain in your: ? Belly (abdomen). ? Legs. ? Lower back. ? Bladder.  You see blood in the catheter.  Your pee is pink or red.  You feel sick to your stomach (nauseous).  You throw up (vomit).  You have chills.  Your pee is not draining into the bag.  Your catheter gets pulled out. Summary  An indwelling urinary catheter is a thin tube that is placed into the bladder to help drain pee (urine) out of the body.  The catheter is placed into the part of the body that drains pee from the bladder (urethra).  Taking good care of your catheter will keep it working properly and help prevent problems.  Always wash your hands before and after touching your catheter or bag.  Never pull on your catheter or try to take it out. This information is not intended to replace advice given to you by your health care provider. Make sure you discuss any questions you have with your health care provider. Document Released: 02/22/2013 Document Revised: 02/19/2019 Document Reviewed: 06/13/2017 Elsevier Patient Education  [employer].    Transurethral Resection of the Prostate, Care After This sheet gives you information about how to care for yourself after your procedure. Your health care provider may also give you more specific instructions. If you have problems or questions, contact your health care provider. What can I expect after the procedure? After the procedure, it is common to have:  Mild pain in your lower abdomen.  Soreness or mild discomfort in your penis from having the catheter inserted during the procedure.  A feeling of urgency when you need to urinate.  A small  amount of blood in your urine. You may notice some small blood clots in your urine. These are normal. Follow these instructions at home: Medicines  Take over-the-counter and prescription medicines only as told by your health care provider.  If you were prescribed an antibiotic medicine, take it as told by your health care provider. Do not stop taking the antibiotic even if you start to feel better.  Ask your health care provider if the medicine prescribed to you: ? Requires you to avoid driving or using heavy machinery. ? Can cause constipation. You may need to take actions to prevent or treat constipation, such as:  Take over-the-counter or prescription medicines.  Eat foods that are high in fiber, such as fresh fruits and vegetables, whole grains, and beans.  Limit foods that are high in fat and processed sugars, such as fried or sweet foods.  Do not drive for 24 hours if you were given a sedative during your procedure. Activity   Return to your normal activities as told by your health care provider. Ask your health care provider what activities are safe for you.  Do not lift anything that is heavier than 10 lb (4.5 kg), or the limit that you are told, for 3 weeks after the procedure or until your health care provider says that it is safe.  Avoid intense physical activity for as long as told by your health care provider.  Avoid sitting for a long time without moving. Get up and move around one or more times every few hours. This helps to prevent blood clots. You may increase your physical activity gradually as you start to feel better. Lifestyle  Do not drink alcohol for as long as told by your health care provider. This  is especially important if you are taking prescription pain medicines.  Do not engage in sexual activity until your health care provider says that you can do this. General instructions   Do not take baths, swim, or use a hot tub until your health care provider  approves.  Drink enough fluid to keep your urine pale yellow.  Urinate as soon as you feel the need to. Do not try to hold your urine for long periods of time.  If your health care provider approves, you may take a stool softener for 2-3 weeks to prevent you from straining to have a bowel movement.  Wear compression stockings as told by your health care provider. These stockings help to prevent blood clots and reduce swelling in your legs.  Keep all follow-up visits as told by your health care provider. This is important. Contact a health care provider if you have:  Difficulty urinating.  A fever.  Pain that gets worse or does not improve with medicine.  Blood in your urine that does not go away after 1 week of resting and drinking more fluids.  Swelling in your penis or testicles. Get help right away if:  You are unable to urinate.  You are having more blood clots in your urine instead of fewer.  You have: ? Large blood clots. ? A lot of blood in your urine. ? Pain in your back or lower abdomen. ? Pain or swelling in your legs. ? Chills and you are shaking. ? Difficulty breathing or shortness of breath. Summary  After the procedure, it is common to have a small amount of blood in your urine.  Avoid heavy lifting and intense physical activity for as long as told by your health care provider.  Urinate as soon as you feel the need to. Do not try to hold your urine for long periods of time.  Keep all follow-up visits as told by your health care provider. This is important. This information is not intended to replace advice given to you by your health care provider. Make sure you discuss any questions you have with your health care provider. Document Released: 10/28/2005 Document Revised: 02/17/2019 Document Reviewed: 07/29/2018 Elsevier Patient Education  2020 [employer].

## 2019-11-06 NOTE — Discharge Summary (Signed)
Physician Discharge Summary  Patient ID: Timothy Rich MRN: [id number] DOB/AGE: 63-Jan-1957 63 y.o.  Admit date: 10/28/2019 Discharge date: 10/29/2019  Admission Diagnoses:  BPH  Discharge Diagnoses:  Active Problems:   BPH (benign prostatic hyperplasia)   Past Medical History:  Diagnosis Date  . Arthritis   . BPH (benign prostatic hyperplasia)   . Complication of anesthesia    low bp with hx replaced 2016  . High cholesterol   . Hypertension     Surgeries: Procedure(s): TRANSURETHRAL RESECTION OF THE PROSTATE (TURP) on 10/28/2019   Consultants (if any):   Discharged Condition: Improved  Hospital Course: Timothy Rich is an 63 y.o. male who was admitted 10/28/2019 with a diagnosis of BPH and went to the operating room on 10/28/2019 and underwent the above named procedures.    He was given perioperative antibiotics:  Anti-infectives (From admission, onward)   Start     Dose/Rate Route Frequency Ordered Stop   10/28/19 1017  cefTRIAXone (ROCEPHIN) 2 g in sodium chloride 0.9 % 100 mL IVPB     2 g 200 mL/hr over 30 Minutes Intravenous 30 min pre-op 10/28/19 1017 10/28/19 1331    .  He was given sequential compression devices, early ambulation for DVT prophylaxis.  He benefited maximally from the hospital stay and there were no complications.    Recent vital signs:  Vitals:   10/29/19 0601 10/29/19 0827  BP: 128/73 128/68  Pulse: 67 79  Resp: 16 18  Temp: (!) 97.3 F (36.3 C) (!) 97.5 F (36.4 C)  SpO2: 100% 100%    Recent laboratory studies:  Lab Results  Component Value Date   HGB 10.9 (L) 10/29/2019   HGB 12.2 (L) 10/28/2019   HGB 14.3 10/28/2019   Lab Results  Component Value Date   WBC 15.3 (H) 10/29/2019   PLT 252 10/29/2019   Lab Results  Component Value Date   INR 1.02 06/23/2015   Lab Results  Component Value Date   NA 138 10/29/2019   K 4.2 10/29/2019   CL 108 10/29/2019   CO2 21 (L) 10/29/2019   BUN 27 (H) 10/29/2019   CREATININE 1.60  (H) 10/29/2019   GLUCOSE 122 (H) 10/29/2019    Discharge Medications:   Allergies as of 10/29/2019   No Known Allergies     Medication List    TAKE these medications   aspirin EC 81 MG tablet Take 81 mg by mouth every other day.   atorvastatin 80 MG tablet Commonly known as: LIPITOR Take 80 mg by mouth at bedtime.   diphenhydrAMINE 25 mg capsule Commonly known as: BENADRYL Take 25 mg by mouth at bedtime as needed. 2 tabs prn at hs   fenofibrate 160 MG tablet Take 160 mg by mouth at bedtime.   finasteride 5 MG tablet Commonly known as: Proscar Take 1 tablet (5 mg total) by mouth daily.   GLUCOSAMINE CHONDR 1500 COMPLX PO Take by mouth. 2 tabs at hs   lisinopril-hydrochlorothiazide 20-12.5 MG tablet Commonly known as: ZESTORETIC Take 1 tablet by mouth daily.   oxyCODONE-acetaminophen 5-325 MG tablet Commonly known as: PERCOCET/ROXICET Take 1-2 tablets by mouth every 4 (four) hours as needed for moderate pain.   UNABLE TO FIND Med Name: testerone injection 200 mg every other sunday       Diagnostic Studies: No results found.  Disposition: Discharge disposition: 01-Home or Self Care       Discharge Instructions    Discharge patient   Complete by: As directed  Discharge disposition: 01-Home or Self Care   Discharge patient date: 10/29/2019      Follow-up Information    Khilynn Borntreger, Candee Furbish, MD. Call on 11/02/2019.   Specialty: Urology Contact information: [address] [place] [address] [phone]            Signed: Nicolette Bang 11/06/2019, 10:10 AM

## 2020-05-09 ENCOUNTER — Other Ambulatory Visit: Payer: Self-pay

## 2020-05-09 DIAGNOSIS — R338 Other retention of urine: Secondary | ICD-10-CM

## 2020-05-09 DIAGNOSIS — N401 Enlarged prostate with lower urinary tract symptoms: Secondary | ICD-10-CM

## 2020-06-02 ENCOUNTER — Encounter (INDEPENDENT_AMBULATORY_CARE_PROVIDER_SITE_OTHER): Payer: Self-pay | Admitting: *Deleted

## 2020-07-05 ENCOUNTER — Ambulatory Visit (HOSPITAL_COMMUNITY)
Admission: RE | Admit: 2020-07-05 | Discharge: 2020-07-05 | Disposition: A | Discharge status: Home / Self Care | Payer: BC Managed Care – PPO | Source: Ambulatory Visit | Attending: Urology | Admitting: Urology

## 2020-07-05 ENCOUNTER — Other Ambulatory Visit: Payer: Self-pay

## 2020-07-05 DIAGNOSIS — N401 Enlarged prostate with lower urinary tract symptoms: Secondary | ICD-10-CM | POA: Insufficient documentation

## 2020-07-05 DIAGNOSIS — R338 Other retention of urine: Secondary | ICD-10-CM | POA: Insufficient documentation

## 2020-07-12 NOTE — Progress Notes (Signed)
Letter sent.

## 2020-08-02 ENCOUNTER — Telehealth (INDEPENDENT_AMBULATORY_CARE_PROVIDER_SITE_OTHER): Payer: Self-pay | Admitting: *Deleted

## 2020-08-02 ENCOUNTER — Encounter (INDEPENDENT_AMBULATORY_CARE_PROVIDER_SITE_OTHER): Payer: Self-pay | Admitting: *Deleted

## 2020-08-02 NOTE — Telephone Encounter (Signed)
This encounter was created in error - please disregard.

## 2020-08-02 NOTE — Telephone Encounter (Signed)
Dr C - Room 1  Referring MD/PCP: blair   Procedure: tcs   Reason/Indication:  screening  Has patient had this procedure before?  Yes, 11 yrs ago  If so, when, by whom and where?    Is there a family history of colon cancer?  no  Who?  What age when diagnosed?    Is patient diabetic?   no      Does patient have prosthetic heart valve or mechanical valve?  no  Do you have a pacemaker/defibrillator?  no  Has patient ever had endocarditis/atrial fibrillation? no  Does patient use oxygen? no  Has patient had joint replacement within last 12 months?  no  Is patient constipated or do they take laxatives? no  Does patient have a history of alcohol/drug use?  no  Is patient on blood thinner such as Coumadin, Plavix and/or Aspirin? yes  Medications: asa 81 mg daily, amlodipine 5 mg daily, lisinopril/hctz 12.5 mg daily, fenofibrate 160 mg daily, atorvastatin 80 mg daily, glucosamine 1500 mg daily, omeprazole 40 mg daily, finasteride 5 mg daily, testosterone   Allergies: nkda  Medication Adjustment per Dr Rehman/Dr Jenetta Downer   Procedure date & time:

## 2020-08-09 ENCOUNTER — Other Ambulatory Visit: Payer: Self-pay

## 2020-08-09 ENCOUNTER — Encounter: Payer: Self-pay | Admitting: Urology

## 2020-08-09 ENCOUNTER — Ambulatory Visit (INDEPENDENT_AMBULATORY_CARE_PROVIDER_SITE_OTHER): Payer: BC Managed Care – PPO | Admitting: Urology

## 2020-08-09 VITALS — BP 126/77 | HR 88 | Temp 99.1°F | Ht 74.0 in | Wt 240.0 lb

## 2020-08-09 DIAGNOSIS — N401 Enlarged prostate with lower urinary tract symptoms: Secondary | ICD-10-CM

## 2020-08-09 LAB — URINALYSIS, ROUTINE W REFLEX MICROSCOPIC
Bilirubin, UA: NEGATIVE
Glucose, UA: NEGATIVE
Ketones, UA: NEGATIVE
Leukocytes,UA: NEGATIVE
Nitrite, UA: NEGATIVE
Protein,UA: NEGATIVE
RBC, UA: NEGATIVE
Specific Gravity, UA: 1.02 (ref 1.005–1.030)
Urobilinogen, Ur: 0.2 mg/dL (ref 0.2–1.0)
pH, UA: 6 (ref 5.0–7.5)

## 2020-08-09 LAB — BLADDER SCAN AMB NON-IMAGING: Scan Result: 359

## 2020-08-09 NOTE — Patient Instructions (Signed)

## 2020-08-09 NOTE — Progress Notes (Signed)

## 2020-08-09 NOTE — Progress Notes (Signed)
08/09/2020 2:08 PM   Timothy Rich 03-19-1956 [id number]  Referring provider: Tempie Hoist, [place] [address]  Followup BPH  HPI: [name] is a 64yo here for followup for BPH with urinary retention. Stream strong, mild urgency, nocturia 1-2x. No dysuria or hematuria. He underwent renal US last month which showed no hydronephrosis. He did have a ful bladde at the time of the renal US  His records from [facility] are as follows: I have urinary retention.  HPI: Michaeal Rich is a 64 year-old male established patient who is here for urinary retention.  His problem was diagnosed approximately 09/02/2019. His current symptoms did not begin after he had a surgical procedure. His urinary retention is being treated with flomax and proscar. Patient denies foley catheter, suprapubic tube, intemittent catheterization, hytrin, cardura, uroxatrol, rapaflo, and avodart.   He does not have an abnormal sensation when needing to urinate. He does not have to strain or bear down to start his urinary stream. He does have a good size and strength to his urinary stream. He is not having problems with emptying his bladder well. His urine has not shut off completely.   He is not having problems with urinary control or incontinence.   He has not previously had an indwelling catheter in for more than two weeks at a time.   09/03/2019: The patient underwent Spine MRI by Dr. Berenice Primas office which showed bilateral hydronephrosis. PVR today is over 1000cc. He developed lower back pain since JUne 2020. Stream is weak. He has an occasional feeling of incomplete emptying. Nocturia 2-3x.  Creatinine 1.33 in June 2020. He is on TRT for 1 year. PSA normal.   10/01/2019: UDS shows 41cm H2O. capacity 750cc. no flow.   11/02/2019: Pt underwent TURP with Dr Alyson Ingles on 12/17. He presented to the office on 12/18 for cath irrigation due to catheter not draining, this was easily corrected. His  catheter has been draining well since then. Denies painful urgency, leaking, hematuria has resolved. Denies interval f/c, n/v.   12/03/2019: Recurrence of urinary retention after last office visit with Foley catheter placed at a local emergency department in [place]. He underwent repeat trial of void on 01/05 and was documented as successful. He telephoned the office with worsening symptomatology last week. Recommendations provided about management, f/u instructions, continuing tamsulosin provided. He returns today for repeat evaluation. He reports that for the past week he has felt a significant improvement in his urinary symptoms. He states he is very pleased with his stream, decreased frequency and decreased nocturia. He denies hematuria, dysuria and fevers. He takes testosterone injetions, this is managed by his PMD, Dr. Benjie Karvonen. He is wanting to resume his testoserone injections and would like to know if it is safe to do so today.   04/06/2020: PVR 400cc. Stream strong. Nocturia 0-1x. NO incontinence       PMH: Past Medical History:  Diagnosis Date  . Arthritis   . BPH (benign prostatic hyperplasia)   . Complication of anesthesia    low bp with hx replaced 2016  . High cholesterol   . Hypertension     Surgical History: Past Surgical History:  Procedure Laterality Date  . APPENDECTOMY    . COLON SURGERY    . KNEE ARTHROSCOPY     left knee  . pilonidal cysts     as a teen ager  . 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 TRANSURETHRAL RESECTION OF PROSTATE N/A 10/28/2019   Procedure: TRANSURETHRAL RESECTION OF THE PROSTATE (TURP);  Surgeon: Cleon Gustin, MD;  Location: [facility];  Service: Urology;  Laterality: N/A;  . VASECTOMY      Home Medications:  Allergies as of 08/09/2020      Reactions   Simvastatin        Medication List       Accurate as of August 09, 2020  2:08 PM. If you have any questions, ask your nurse or doctor.        STOP taking these medications   oxyCODONE-acetaminophen 5-325 MG tablet Commonly known as: PERCOCET/ROXICET Stopped by: Nicolette Bang, MD     TAKE these medications   amLODipine 10 MG tablet Commonly known as: NORVASC Take 10 mg by mouth daily.   aspirin EC 81 MG tablet Take 81 mg by mouth every other day.   atorvastatin 80 MG tablet Commonly known as: LIPITOR Take 80 mg by mouth at bedtime.   diphenhydrAMINE 25 mg capsule Commonly known as: BENADRYL Take 25 mg by mouth at bedtime as needed. 2 tabs prn at hs   fenofibrate 160 MG tablet Take 160 mg by mouth at bedtime.   finasteride 5 MG tablet Commonly known as: Proscar Take 1 tablet (5 mg total) by mouth daily.   GLUCOSAMINE CHONDR 1500 COMPLX PO Take by mouth. 2 tabs at hs   HYDROcodone-acetaminophen 5-325 MG tablet Commonly known as: NORCO/VICODIN   lisinopril-hydrochlorothiazide 20-12.5 MG tablet Commonly known as: ZESTORETIC Take 1 tablet by mouth daily.   omeprazole 40 MG capsule Commonly known as: PRILOSEC Take 40 mg by mouth daily.   promethazine 12.5 MG tablet Commonly known as: PHENERGAN Take by mouth.   Testosterone Cypionate 200 MG/ML Soln Inject into the muscle.   testosterone cypionate 200 MG/ML injection Commonly known as: DEPOTESTOSTERONE CYPIONATE Inject 200 mg into the muscle every 14 (fourteen) days.   testosterone enanthate 200 MG/ML injection Commonly known as: DELATESTRYL Inject into the muscle.   tiZANidine 4 MG tablet Commonly known as: ZANAFLEX Take by mouth.   traMADol 50 MG tablet Commonly known as: ULTRAM Take 50 mg by mouth every 6 (six) hours as needed.   UNABLE TO FIND Med Name: testerone injection 200 mg every other sunday       Allergies:  Allergies  Allergen Reactions  . Simvastatin     Family History: No family  history on file.  Social History:  reports that he has never smoked. He has never used smokeless tobacco. He reports current alcohol use of about 7.0 standard drinks of alcohol per week. He reports that he does not use drugs.  ROS: All other review of systems were reviewed and are negative except what is noted above in HPI  Physical Exam: BP 126/77   Pulse 88   Temp 99.1 F (37.3 C)   Ht 6\' 2"  (1.88 m)   Wt 240 lb (108.9 kg)   BMI 30.81 kg/m   Constitutional:  Alert and oriented, No acute distress. HEENT: [place] AT, moist mucus membranes.  Trachea midline, no masses. Cardiovascular: No clubbing, cyanosis, or edema. Respiratory: Normal respiratory effort, no increased work of breathing. GI: Abdomen is soft, nontender, nondistended, no abdominal masses GU: No CVA tenderness.  Lymph: No cervical or inguinal lymphadenopathy. Skin: No rashes, bruises or suspicious lesions. Neurologic: Grossly intact, no focal deficits, moving  all 4 extremities. Psychiatric: Normal mood and affect.  Laboratory Data: Lab Results  Component Value Date   WBC 15.3 (H) 10/29/2019   HGB 10.9 (L) 10/29/2019   HCT 33.9 (L) 10/29/2019   MCV 97.1 10/29/2019   PLT 252 10/29/2019    Lab Results  Component Value Date   CREATININE 1.60 (H) 10/29/2019    No results found for: PSA  No results found for: TESTOSTERONE  No results found for: HGBA1C  Urinalysis    Component Value Date/Time   COLORURINE YELLOW 06/23/2015 [place] 06/23/2015 0944   LABSPEC 1.015 06/23/2015 0944   PHURINE 6.0 06/23/2015 [place] 06/23/2015 [place] 06/23/2015 [place] 06/23/2015 [place] 06/23/2015 0944   PROTEINUR NEGATIVE 06/23/2015 0944   UROBILINOGEN 0.2 06/23/2015 0944   NITRITE NEGATIVE 06/23/2015 0944   LEUKOCYTESUR NEGATIVE 06/23/2015 0944    No results found for: LABMICR, WBCUA, RBCUA, LABEPIT, MUCUS, BACTERIA  Pertinent  Imaging: Renal US 07/05/2020: Images reviewed and discussed with the patient No results found for this or any previous visit.  No results found for this or any previous visit.  No results found for this or any previous visit.  No results found for this or any previous visit.  Results for orders placed during the hospital encounter of 07/05/20  US RENAL  Narrative CLINICAL DATA:  Urinary retention.  EXAM: RENAL / URINARY TRACT ULTRASOUND COMPLETE  COMPARISON:  Apr 04, 2013.  FINDINGS: Right Kidney:  Renal measurements: 10.7 x 5.9 x 5.4 cm = volume: 180 mL. 2.6 cm simple cyst is seen in upper pole. Echogenicity within normal limits. No mass or hydronephrosis visualized.  Left Kidney:  Renal measurements: 12.0 x 7.1 x 5.1 cm = volume: 225 mL. Echogenicity within normal limits. No mass or hydronephrosis visualized.  Bladder:  Appears normal for degree of bladder distention. Calculated prevoid volume of 588 mL. Calculated postvoid volume of 587 mL which is not significantly changed.  Other:  None.  IMPRESSION: No significant renal abnormality is noted.  Mild urinary bladder distention is noted which is not significantly changed on postvoid exam.   Electronically Signed By: Marijo Conception M.D. On: 07/05/2020 09:17  No results found for this or any previous visit.  No results found for this or any previous visit.  No results found for this or any previous visit.   Assessment & Plan:    1. Benign localized prostatic hyperplasia with lower urinary tract symptoms (LUTS) -Continue double voiding - Urinalysis, Routine w reflex microscopic  2. Urinary retention -Double voiding   No follow-ups on file.  Nicolette Bang, MD  [facility] [place]

## 2020-08-09 NOTE — Addendum Note (Signed)
Addended byIris Pert on: 08/09/2020 02:29 PM   Modules accepted: Orders

## 2020-10-23 ENCOUNTER — Other Ambulatory Visit: Payer: Self-pay | Admitting: Urology

## 2021-01-17 ENCOUNTER — Other Ambulatory Visit: Payer: Self-pay | Admitting: Urology

## 2021-01-22 ENCOUNTER — Other Ambulatory Visit: Payer: Self-pay

## 2021-01-22 ENCOUNTER — Telehealth: Payer: Self-pay

## 2021-01-22 NOTE — Telephone Encounter (Signed)
Pts wife called saying Finasteride needed to be refilled. Called pt back and he had gotten a text from pharmacy saying med was ready.

## 2021-02-07 ENCOUNTER — Other Ambulatory Visit: Payer: Self-pay

## 2021-02-07 ENCOUNTER — Ambulatory Visit (INDEPENDENT_AMBULATORY_CARE_PROVIDER_SITE_OTHER): Payer: BC Managed Care – PPO | Admitting: Urology

## 2021-02-07 ENCOUNTER — Encounter: Payer: Self-pay | Admitting: Urology

## 2021-02-07 VITALS — BP 143/77 | HR 94 | Temp 98.5°F | Ht 74.0 in | Wt 255.0 lb

## 2021-02-07 DIAGNOSIS — N401 Enlarged prostate with lower urinary tract symptoms: Secondary | ICD-10-CM | POA: Diagnosis not present

## 2021-02-07 LAB — URINALYSIS, ROUTINE W REFLEX MICROSCOPIC
Bilirubin, UA: NEGATIVE
Glucose, UA: NEGATIVE
Ketones, UA: NEGATIVE
Leukocytes,UA: NEGATIVE
Nitrite, UA: NEGATIVE
Protein,UA: NEGATIVE
RBC, UA: NEGATIVE
Specific Gravity, UA: 1.02 (ref 1.005–1.030)
Urobilinogen, Ur: 1 mg/dL (ref 0.2–1.0)
pH, UA: 7 (ref 5.0–7.5)

## 2021-02-07 LAB — BLADDER SCAN AMB NON-IMAGING: Scan Result: 627

## 2021-02-07 MED ORDER — FINASTERIDE 5 MG PO TABS: 5.0000 mg | ORAL_TABLET | Freq: Every day | ORAL | 3 refills | Status: DC

## 2021-02-07 NOTE — Progress Notes (Signed)
02/07/2021 9:15 AM   [name] 1956-06-03 [id number]  Referring provider: Tempie Hoist, [place] [address]  followup BPH  HPI: Mr Timothy Rich is a 65yo here for followup for BPH. IPSS 1 and QOL 0. No complaints today. PVR 627 but he has no urinated this morning. He is on finasteride 5mg  daily. PSA was normal per patient   PMH: Past Medical History:  Diagnosis Date  . Arthritis   . BPH (benign prostatic hyperplasia)   . Complication of anesthesia    low bp with hx replaced 2016  . High cholesterol   . Hypertension     Surgical History: Past Surgical History:  Procedure Laterality Date  . APPENDECTOMY    . COLON SURGERY    . KNEE ARTHROSCOPY     left knee  . pilonidal cysts     as a teen ager  . TOTAL HIP ARTHROPLASTY Right 07/04/2015   Procedure: TOTAL HIP ARTHROPLASTY ANTERIOR APPROACH;  Surgeon: Melrose Nakayama, MD;  Location: [facility];  Service: Orthopedics;  Laterality: Right;  . TRANSURETHRAL RESECTION OF PROSTATE N/A 10/28/2019   Procedure: TRANSURETHRAL RESECTION OF THE PROSTATE (TURP);  Surgeon: Cleon Gustin, MD;  Location: [facility];  Service: Urology;  Laterality: N/A;  . VASECTOMY      Home Medications:  Allergies as of 02/07/2021      Reactions   Simvastatin       Medication List       Accurate as of February 07, 2021  9:15 AM. If you have any questions, ask your nurse or doctor.        amLODipine 10 MG tablet Commonly known as: NORVASC Take 10 mg by mouth daily.   aspirin EC 81 MG tablet Take 81 mg by mouth every other day.   atorvastatin 80 MG tablet Commonly known as: LIPITOR Take 80 mg by mouth at bedtime.   diphenhydrAMINE 25 mg capsule Commonly known as: BENADRYL Take 25 mg by mouth at bedtime as needed. 2 tabs prn at hs   fenofibrate 160 MG tablet Take 160 mg by mouth at bedtime.   finasteride 5 MG tablet Commonly known as: PROSCAR Take 1 tablet by mouth once daily    GLUCOSAMINE CHONDR 1500 COMPLX PO Take by mouth. 2 tabs at hs   HYDROcodone-acetaminophen 5-325 MG tablet Commonly known as: NORCO/VICODIN   lisinopril-hydrochlorothiazide 20-12.5 MG tablet Commonly known as: ZESTORETIC Take 1 tablet by mouth daily.   omeprazole 40 MG capsule Commonly known as: PRILOSEC Take 40 mg by mouth daily.   promethazine 12.5 MG tablet Commonly known as: PHENERGAN Take by mouth.   Testosterone Cypionate 200 MG/ML Soln Inject into the muscle.   testosterone cypionate 200 MG/ML injection Commonly known as: DEPOTESTOSTERONE CYPIONATE Inject 200 mg into the muscle every 14 (fourteen) days.   testosterone enanthate 200 MG/ML injection Commonly known as: DELATESTRYL Inject into the muscle.   tiZANidine 4 MG tablet Commonly known as: ZANAFLEX Take by mouth.   traMADol 50 MG tablet Commonly known as: ULTRAM Take 50 mg by mouth every 6 (six) hours as needed.   UNABLE TO FIND Med Name: testerone injection 200 mg every other sunday       Allergies:  Allergies  Allergen Reactions  . Simvastatin     Family History: No family history on file.  Social History:  reports that he has never smoked. He has never used smokeless tobacco. He reports current alcohol use of  about 7.0 standard drinks of alcohol per week. He reports that he does not use drugs.  ROS: All other review of systems were reviewed and are negative except what is noted above in HPI  Physical Exam: BP (!) 143/77   Pulse 94   Temp 98.5 F (36.9 C)   Ht 6\' 2"  (1.88 m)   Wt 255 lb (115.7 kg)   BMI 32.74 kg/m   Constitutional:  Alert and oriented, No acute distress. HEENT: [place] AT, moist mucus membranes.  Trachea midline, no masses. Cardiovascular: No clubbing, cyanosis, or edema. Respiratory: Normal respiratory effort, no increased work of breathing. GI: Abdomen is soft, nontender, nondistended, no abdominal masses GU: No CVA tenderness.  Lymph: No cervical or inguinal  lymphadenopathy. Skin: No rashes, bruises or suspicious lesions. Neurologic: Grossly intact, no focal deficits, moving all 4 extremities. Psychiatric: Normal mood and affect.  Laboratory Data: Lab Results  Component Value Date   WBC 15.3 (H) 10/29/2019   HGB 10.9 (L) 10/29/2019   HCT 33.9 (L) 10/29/2019   MCV 97.1 10/29/2019   PLT 252 10/29/2019    Lab Results  Component Value Date   CREATININE 1.60 (H) 10/29/2019    No results found for: PSA  No results found for: TESTOSTERONE  No results found for: HGBA1C  Urinalysis    Component Value Date/Time   COLORURINE YELLOW 06/23/2015 0944   APPEARANCEUR Clear 08/09/2020 1353   LABSPEC 1.015 06/23/2015 0944   PHURINE 6.0 06/23/2015 0944   GLUCOSEU Negative 08/09/2020 [place] 06/23/2015 0944   BILIRUBINUR Negative 08/09/2020 [place] 06/23/2015 0944   PROTEINUR Negative 08/09/2020 1353   PROTEINUR NEGATIVE 06/23/2015 0944   UROBILINOGEN 0.2 06/23/2015 0944   NITRITE Negative 08/09/2020 1353   NITRITE NEGATIVE 06/23/2015 0944   LEUKOCYTESUR Negative 08/09/2020 1353    Lab Results  Component Value Date   LABMICR Comment 08/09/2020    Pertinent Imaging:  No results found for this or any previous visit.  No results found for this or any previous visit.  No results found for this or any previous visit.  No results found for this or any previous visit.  Results for orders placed during the hospital encounter of 07/05/20  US RENAL  Narrative CLINICAL DATA:  Urinary retention.  EXAM: RENAL / URINARY TRACT ULTRASOUND COMPLETE  COMPARISON:  Apr 04, 2013.  FINDINGS: Right Kidney:  Renal measurements: 10.7 x 5.9 x 5.4 cm = volume: 180 mL. 2.6 cm simple cyst is seen in upper pole. Echogenicity within normal limits. No mass or hydronephrosis visualized.  Left Kidney:  Renal measurements: 12.0 x 7.1 x 5.1 cm = volume: 225 mL. Echogenicity within normal limits. No mass or  hydronephrosis visualized.  Bladder:  Appears normal for degree of bladder distention. Calculated prevoid volume of 588 mL. Calculated postvoid volume of 587 mL which is not significantly changed.  Other:  None.  IMPRESSION: No significant renal abnormality is noted.  Mild urinary bladder distention is noted which is not significantly changed on postvoid exam.   Electronically Signed By: Marijo Conception M.D. On: 07/05/2020 09:17  No results found for this or any previous visit.  No results found for this or any previous visit.  No results found for this or any previous visit.   Assessment & Plan:    1. Benign localized prostatic hyperplasia with lower urinary tract symptoms (LUTS) -RTC 1 year with PVR -Patient to have PSA results sent to office - Urinalysis,  Routine w reflex microscopic - BLADDER SCAN AMB NON-IMAGING -finasteride 5mg  daily   No follow-ups on file.  Nicolette Bang, MD  [facility] [place]

## 2021-02-07 NOTE — Patient Instructions (Signed)

## 2021-02-07 NOTE — Progress Notes (Signed)
Bladder Scan Patient can void: 627 ml Performed By: Durenda Guthrie, lpn  Urological Symptom Review  Patient is experiencing the following symptoms: none   Review of Systems  Gastrointestinal (upper)  : Negative for upper GI symptoms  Gastrointestinal (lower) : Negative for lower GI symptoms  Constitutional : Negative for symptoms  Skin: Negative for skin symptoms  Eyes: Negative for eye symptoms  Ear/Nose/Throat : Negative for Ear/Nose/Throat symptoms  Hematologic/Lymphatic: Negative for Hematologic/Lymphatic symptoms  Cardiovascular : Negative for cardiovascular symptoms  Respiratory : Negative for respiratory symptoms  Endocrine: Negative for endocrine symptoms  Musculoskeletal: Negative for musculoskeletal symptoms  Neurological: Negative for neurological symptoms  Psychologic: Negative for psychiatric symptoms

## 2021-05-31 ENCOUNTER — Encounter (INDEPENDENT_AMBULATORY_CARE_PROVIDER_SITE_OTHER): Payer: Self-pay | Admitting: *Deleted

## 2021-07-31 ENCOUNTER — Other Ambulatory Visit (INDEPENDENT_AMBULATORY_CARE_PROVIDER_SITE_OTHER): Payer: Self-pay

## 2021-07-31 ENCOUNTER — Encounter (INDEPENDENT_AMBULATORY_CARE_PROVIDER_SITE_OTHER): Payer: Self-pay | Admitting: *Deleted

## 2021-07-31 ENCOUNTER — Telehealth (INDEPENDENT_AMBULATORY_CARE_PROVIDER_SITE_OTHER): Payer: Self-pay

## 2021-07-31 ENCOUNTER — Encounter (INDEPENDENT_AMBULATORY_CARE_PROVIDER_SITE_OTHER): Payer: Self-pay

## 2021-07-31 DIAGNOSIS — Z1211 Encounter for screening for malignant neoplasm of colon: Secondary | ICD-10-CM

## 2021-07-31 MED ORDER — PEG 3350-KCL-NA BICARB-NACL 420 G PO SOLR: 4000.0000 mL | ORAL | 0 refills | Status: DC

## 2021-07-31 NOTE — Telephone Encounter (Signed)
Timothy Rich, CMA  

## 2021-07-31 NOTE — Telephone Encounter (Signed)
LeighAnn Arnol Mcgibbon, CMA  

## 2021-07-31 NOTE — Telephone Encounter (Signed)
Error   This encounter was created in error - please disregard. 

## 2021-09-19 ENCOUNTER — Ambulatory Visit (HOSPITAL_COMMUNITY): Payer: Medicare Other | Admitting: Anesthesiology

## 2021-09-19 ENCOUNTER — Encounter (HOSPITAL_COMMUNITY)
Admission: RE | Disposition: A | Discharge status: Home / Self Care | Payer: Self-pay | Source: Home / Self Care | Attending: Gastroenterology

## 2021-09-19 ENCOUNTER — Encounter (HOSPITAL_COMMUNITY): Payer: Self-pay | Admitting: Gastroenterology

## 2021-09-19 ENCOUNTER — Ambulatory Visit (HOSPITAL_COMMUNITY)
Admission: RE | Admit: 2021-09-19 | Discharge: 2021-09-19 | Disposition: A | Discharge status: Home / Self Care | Payer: Medicare Other | Attending: Gastroenterology | Admitting: Gastroenterology

## 2021-09-19 ENCOUNTER — Other Ambulatory Visit: Payer: Self-pay

## 2021-09-19 DIAGNOSIS — Z980 Intestinal bypass and anastomosis status: Secondary | ICD-10-CM | POA: Insufficient documentation

## 2021-09-19 DIAGNOSIS — K219 Gastro-esophageal reflux disease without esophagitis: Secondary | ICD-10-CM | POA: Insufficient documentation

## 2021-09-19 DIAGNOSIS — K6289 Other specified diseases of anus and rectum: Secondary | ICD-10-CM | POA: Diagnosis not present

## 2021-09-19 DIAGNOSIS — D122 Benign neoplasm of ascending colon: Secondary | ICD-10-CM | POA: Diagnosis not present

## 2021-09-19 DIAGNOSIS — I10 Essential (primary) hypertension: Secondary | ICD-10-CM | POA: Diagnosis not present

## 2021-09-19 DIAGNOSIS — Z1211 Encounter for screening for malignant neoplasm of colon: Secondary | ICD-10-CM | POA: Diagnosis present

## 2021-09-19 HISTORY — PX: POLYPECTOMY: SHX5525

## 2021-09-19 HISTORY — PX: BIOPSY: SHX5522

## 2021-09-19 HISTORY — PX: COLONOSCOPY WITH PROPOFOL: SHX5780

## 2021-09-19 LAB — HM COLONOSCOPY

## 2021-09-19 SURGERY — COLONOSCOPY WITH PROPOFOL
Anesthesia: General

## 2021-09-19 MED ORDER — LACTATED RINGERS IV SOLN: INTRAVENOUS | Status: DC

## 2021-09-19 MED ORDER — PROPOFOL 10 MG/ML IV BOLUS
INTRAVENOUS | Status: AC
  Filled 2021-09-19: qty 80

## 2021-09-19 MED ORDER — EPHEDRINE SULFATE 50 MG/ML IJ SOLN
INTRAMUSCULAR | Status: DC | PRN
  Administered 2021-09-19: 10 mg via INTRAVENOUS

## 2021-09-19 MED ORDER — EPHEDRINE 5 MG/ML INJ
INTRAVENOUS | Status: AC
  Filled 2021-09-19: qty 5

## 2021-09-19 MED ORDER — STERILE WATER FOR IRRIGATION IR SOLN
Status: DC | PRN
  Administered 2021-09-19: .6 mL

## 2021-09-19 MED ORDER — LACTATED RINGERS IV SOLN: INTRAVENOUS | Status: DC | PRN

## 2021-09-19 MED ORDER — PROPOFOL 10 MG/ML IV BOLUS
INTRAVENOUS | Status: DC | PRN
  Administered 2021-09-19 (×2): 50 mg via INTRAVENOUS
  Administered 2021-09-19: 100 mg via INTRAVENOUS
  Administered 2021-09-19 (×2): 50 mg via INTRAVENOUS

## 2021-09-19 NOTE — Op Note (Addendum)
[facility] Patient Name: Timothy Rich Procedure Date: 09/19/2021 7:12 AM MRN: [id number] Date of Birth: 30-Oct-1956 Attending MD: Maylon Peppers ,  CSN: [id number] Age: 65 Admit Type: Outpatient Procedure:                Colonoscopy Indications:              Screening for colorectal malignant neoplasm Providers:                Maylon Peppers, Janeece Riggers, RN, Kristine L.                            Risa Grill, Technician Referring MD:              Medicines:                Monitored Anesthesia Care Complications:            No immediate complications. Estimated Blood Loss:     Estimated blood loss: none. Procedure:                Pre-Anesthesia Assessment:                           - Prior to the procedure, a History and Physical                            was performed, and patient medications, allergies                            and sensitivities were reviewed. The patient's                            tolerance of previous anesthesia was reviewed.                           - The risks and benefits of the procedure and the                            sedation options and risks were discussed with the                            patient. All questions were answered and informed                            consent was obtained.                           - ASA Grade Assessment: II - A patient with mild                            systemic disease.                           After obtaining informed consent, the colonoscope                            was passed under direct vision. Throughout the  procedure, the patient's blood pressure, pulse, and                            oxygen saturations were monitored continuously. The                            [id number]-HQ190L ([id number]) scope was introduced through                            the anus and advanced to the the ileocolonic                            anastomosis. The colonoscopy was performed without                             difficulty. The patient tolerated the procedure                            well. The quality of the bowel preparation was good. Scope In: 7:42:29 AM Scope Out: 8:05:39 AM Scope Withdrawal Time: 0 hours 15 minutes 21 seconds  Total Procedure Duration: 0 hours 23 minutes 10 seconds  Findings:      The perianal and digital rectal examinations were normal.      The neo-terminal ileum appeared normal.      There was evidence of a prior end-to-side ileo-colonic anastomosis in       the cecum. This was patent and was characterized by healthy appearing       mucosa. The anastomosis was traversed.      A 1 mm polyp was found in the ascending colon. The polyp was sessile.       The polyp was removed with a cold biopsy forceps. Resection and       retrieval were complete.      A 3 mm polyp was found in the ascending colon. The polyp was sessile.       The polyp was removed with a cold snare. Resection and retrieval were       complete.      Anal papilla(e) were hypertrophied - seen upon retroflexion. Impression:               - The examined portion of the ileum was normal.                           - Patent end-to-side ileo-colonic anastomosis,                            characterized by healthy appearing mucosa.                           - One 1 mm polyp in the ascending colon, removed                            with a cold biopsy forceps. Resected and retrieved.                           - One 3 mm polyp in the ascending  colon, removed                            with a cold snare. Resected and retrieved.                           - Anal papilla(e) were hypertrophied. Moderate Sedation:      Per Anesthesia Care Recommendation:           - Discharge patient to home (ambulatory).                           - Resume previous diet.                           - Await pathology results.                           - Repeat colonoscopy date to be determined after                             pending pathology results are reviewed for                            surveillance. Procedure Code(s):        --- Professional ---                           [id number], Colonoscopy, flexible; with removal of                            tumor(s), polyp(s), or other lesion(s) by snare                            technique                           45380, 37, Colonoscopy, flexible; with biopsy,                            single or multiple Diagnosis Code(s):        --- Professional ---                           Z12.11, Encounter for screening for malignant                            neoplasm of colon                           Z98.0, Intestinal bypass and anastomosis status                           K63.5, Polyp of colon                           K62.89, Other specified diseases of anus and rectum CPT copyright 2019 [employer]. All rights reserved. The codes documented  in this report are preliminary and upon coder review may  be revised to meet current compliance requirements. Maylon Peppers, MD Maylon Peppers,  09/19/2021 8:10:19 AM This report has been signed electronically. Number of Addenda: 0

## 2021-09-19 NOTE — H&P (Signed)
Timothy Rich is an 65 y.o. male.   Chief Complaint: Screening colonoscopy HPI: 65 year old male with past medical history of BPH, hypertension hyperlipidemia, coming for screening colonoscopy.  Last colonoscopy performed 13 years ago which was normal.  The patient denies having any complaints such as melena, hematochezia, abdominal pain or distention, change in her bowel movement consistency or frequency, no changes in her weight recently.  No family history of colorectal cancer.   Past Medical History:  Diagnosis Date   Arthritis    BPH (benign prostatic hyperplasia)    Complication of anesthesia    low bp with hx replaced 2016   High cholesterol    Hypertension     Past Surgical History:  Procedure Laterality Date   APPENDECTOMY     KNEE ARTHROSCOPY     left knee   left hip arthroplasty Left    pilonidal cysts     as a teen ager   TOTAL HIP ARTHROPLASTY Right 07/04/2015   Procedure: TOTAL HIP ARTHROPLASTY ANTERIOR APPROACH;  Surgeon: Melrose Nakayama, MD;  Location: [place];  Service: Orthopedics;  Laterality: Right;   TRANSURETHRAL RESECTION OF PROSTATE N/A 10/28/2019   Procedure: TRANSURETHRAL RESECTION OF THE PROSTATE (TURP);  Surgeon: Cleon Gustin, MD;  Location: [facility];  Service: Urology;  Laterality: N/A;   VASECTOMY      Family History  Problem Relation Age of Onset   Colon cancer Neg Hx    Social History:  reports that he has never smoked. He has never used smokeless tobacco. He reports current alcohol use of about 7.0 standard drinks per week. He reports that he does not use drugs.  Allergies:  Allergies  Allergen Reactions   Simvastatin     Joint pain    Medications Prior to Admission  Medication Sig Dispense Refill   aspirin EC 81 MG tablet Take 81 mg by mouth every other day.     atorvastatin (LIPITOR) 80 MG tablet Take 80 mg by mouth at bedtime.      fenofibrate 160 MG tablet Take 160 mg by mouth at bedtime.      finasteride  (PROSCAR) 5 MG tablet Take 1 tablet (5 mg total) by mouth daily. 90 tablet 3   Glucosamine-Chondroit-Vit C-Mn (GLUCOSAMINE CHONDR 1500 COMPLX PO) Take 2 tablets by mouth daily.     lisinopril-hydrochlorothiazide (ZESTORETIC) 20-12.5 MG tablet Take 1 tablet by mouth daily.     MELATONIN PO Take 2 capsules by mouth at bedtime.     omeprazole (PRILOSEC) 40 MG capsule Take 40 mg by mouth daily.     polyethylene glycol-electrolytes (TRILYTE) 420 g solution Take 4,000 mLs by mouth as directed. 4000 mL 0   testosterone cypionate (DEPOTESTOSTERONE CYPIONATE) 200 MG/ML injection Inject 200 mg into the muscle every 14 (fourteen) days.     diphenhydrAMINE (BENADRYL) 25 mg capsule Take 25 mg by mouth daily as needed for allergies.     Tetrahydrozoline HCl (VISINE OP) Place 1 drop into both eyes daily as needed (dry eyes).      No results found for this or any previous visit (from the past 48 hour(s)). No results found.  Review of Systems  Constitutional: Negative.   HENT: Negative.    Eyes: Negative.   Respiratory: Negative.    Cardiovascular: Negative.   Gastrointestinal: Negative.   Endocrine: Negative.   Genitourinary: Negative.   Musculoskeletal: Negative.   Skin: Negative.   Allergic/Immunologic: Negative.   Neurological: Negative.   Hematological: Negative.   Psychiatric/Behavioral:  Negative.     Blood pressure (!) 140/91, pulse 77, temperature 98.3 F (36.8 C), temperature source Oral, resp. rate 17, height 6\' 2"  (1.88 m), weight 105.7 kg, SpO2 98 %. Physical Exam  GENERAL: The patient is AO x3, in no acute distress. HEENT: Head is normocephalic and atraumatic. EOMI are intact. Mouth is well hydrated and without lesions. NECK: Supple. No masses LUNGS: Clear to auscultation. No presence of rhonchi/wheezing/rales. Adequate chest expansion HEART: RRR, normal s1 and s2. ABDOMEN: Soft, nontender, no guarding, no peritoneal signs, and nondistended. BS +. No masses. EXTREMITIES: Without  any cyanosis, clubbing, rash, lesions or edema. NEUROLOGIC: AOx3, no focal motor deficit. SKIN: no jaundice, no rashes  Assessment/Plan  65 year old male with past medical history of BPH, hypertension hyperlipidemia, coming for screening colonoscopy.  We will proceed with colonoscopy.  [name], MD 09/19/2021, 7:35 AM

## 2021-09-19 NOTE — Anesthesia Preprocedure Evaluation (Addendum)
Anesthesia Evaluation  Patient identified by MRN, date of birth, ID band Patient awake    Reviewed: Allergy & Precautions, NPO status , Patient's Chart, lab work & pertinent test results  History of Anesthesia Complications (+) history of anesthetic complications  Airway Mallampati: III  TM Distance: >3 FB Neck ROM: Full    Dental  (+) Dental Advisory Given [place] :   Pulmonary neg pulmonary ROS,    Pulmonary exam normal breath sounds clear to auscultation       Cardiovascular hypertension, Pt. on medications Normal cardiovascular exam Rhythm:Regular Rate:Normal     Neuro/Psych negative neurological ROS  negative psych ROS   GI/Hepatic GERD  Medicated,(+)     substance abuse (social)  alcohol use,   Endo/Other  negative endocrine ROS  Renal/GU negative Renal ROS  negative genitourinary   Musculoskeletal  (+) Arthritis , Osteoarthritis,    Abdominal   Peds negative pediatric ROS (+)  Hematology negative hematology ROS (+)   Anesthesia Other Findings   Reproductive/Obstetrics negative OB ROS                            Anesthesia Physical Anesthesia Plan  ASA: 2  Anesthesia Plan: General   Post-op Pain Management:    Induction: Intravenous  PONV Risk Score and Plan: TIVA  Airway Management Planned: Nasal Cannula and Natural Airway  Additional Equipment:   Intra-op Plan:   Post-operative Plan:   Informed Consent: I have reviewed the patients History and Physical, chart, labs and discussed the procedure including the risks, benefits and alternatives for the proposed anesthesia with the patient or authorized representative who has indicated his/her understanding and acceptance.     Dental advisory given  Plan Discussed with: CRNA and Surgeon  Anesthesia Plan Comments:         Anesthesia Quick Evaluation

## 2021-09-19 NOTE — Transfer of Care (Signed)
Immediate Anesthesia Transfer of Care Note  Patient: Timothy Rich  Procedure(s) Performed: COLONOSCOPY WITH PROPOFOL POLYPECTOMY BIOPSY  Patient Location: PACU  Anesthesia Type:MAC  Level of Consciousness: awake, alert  and oriented  Airway & Oxygen Therapy: Patient Spontanous Breathing  Post-op Assessment: Report given to RN  Post vital signs: Reviewed and stable  Last Vitals:  Vitals Value Taken Time  BP    Temp    Pulse    Resp    SpO2      Last Pain:  Vitals:   09/19/21 0744  TempSrc:   PainSc: 0-No pain      Patients Stated Pain Goal: 8 (23/30/07 6226)  Complications: No notable events documented.

## 2021-09-19 NOTE — Addendum Note (Signed)
Addendum  created 09/19/21 1420 by Vista Deck, CRNA   Charge Capture section accepted

## 2021-09-19 NOTE — Discharge Instructions (Addendum)
You are being discharged to home.  Resume your previous diet.  We are waiting for your pathology results.  Your physician has recommended a repeat colonoscopy (date to be determined after pending pathology results are reviewed) for surveillance.  

## 2021-09-19 NOTE — Anesthesia Postprocedure Evaluation (Signed)
Anesthesia Post Note  Patient: Timothy Rich  Procedure(s) Performed: COLONOSCOPY WITH PROPOFOL POLYPECTOMY BIOPSY  Patient location during evaluation: Endoscopy Anesthesia Type: General Level of consciousness: awake and alert and oriented Pain management: pain level controlled Vital Signs Assessment: post-procedure vital signs reviewed and stable Respiratory status: spontaneous breathing, nonlabored ventilation and respiratory function stable Cardiovascular status: blood pressure returned to baseline and stable Postop Assessment: no apparent nausea or vomiting Anesthetic complications: no   No notable events documented.   Last Vitals:  Vitals:   09/19/21 0809 09/19/21 0815  BP:  115/77  Pulse: 83   Resp: 20   Temp: 36.5 C   SpO2:      Last Pain:  Vitals:   09/19/21 0815  TempSrc:   PainSc: 0-No pain                 Darya Bigler C Sacheen Arrasmith

## 2021-09-19 NOTE — OR Nursing (Signed)
At the anastomosis at 0750 per Dr. Jenetta Downer

## 2021-09-20 LAB — SURGICAL PATHOLOGY

## 2021-09-21 ENCOUNTER — Encounter (INDEPENDENT_AMBULATORY_CARE_PROVIDER_SITE_OTHER): Payer: Self-pay | Admitting: *Deleted

## 2021-09-24 ENCOUNTER — Encounter (HOSPITAL_COMMUNITY): Payer: Self-pay | Admitting: Gastroenterology

## 2022-02-06 ENCOUNTER — Ambulatory Visit (INDEPENDENT_AMBULATORY_CARE_PROVIDER_SITE_OTHER): Payer: Medicare Other | Admitting: Urology

## 2022-02-06 ENCOUNTER — Encounter: Payer: Self-pay | Admitting: Urology

## 2022-02-06 ENCOUNTER — Other Ambulatory Visit: Payer: Self-pay

## 2022-02-06 VITALS — BP 119/71 | HR 66

## 2022-02-06 DIAGNOSIS — N1339 Other hydronephrosis: Secondary | ICD-10-CM | POA: Diagnosis not present

## 2022-02-06 DIAGNOSIS — R338 Other retention of urine: Secondary | ICD-10-CM

## 2022-02-06 DIAGNOSIS — N401 Enlarged prostate with lower urinary tract symptoms: Secondary | ICD-10-CM

## 2022-02-06 LAB — URINALYSIS, ROUTINE W REFLEX MICROSCOPIC
Bilirubin, UA: NEGATIVE
Glucose, UA: NEGATIVE
Ketones, UA: NEGATIVE
Leukocytes,UA: NEGATIVE
Nitrite, UA: NEGATIVE
Protein,UA: NEGATIVE
Specific Gravity, UA: 1.015 (ref 1.005–1.030)
Urobilinogen, Ur: 0.2 mg/dL (ref 0.2–1.0)
pH, UA: 6 (ref 5.0–7.5)

## 2022-02-06 LAB — BLADDER SCAN AMB NON-IMAGING: Scan Result: 709

## 2022-02-06 MED ORDER — FINASTERIDE 5 MG PO TABS: 5.0000 mg | ORAL_TABLET | Freq: Every day | ORAL | 3 refills | Status: DC

## 2022-02-06 NOTE — Patient Instructions (Signed)

## 2022-02-06 NOTE — Progress Notes (Signed)
? ?02/06/2022 ?9:23 AM  ? ?Timothy Rich ?[birth year] ?[id number] ? ?Referring provider: Tempie Hoist, FNP ?[address] ?[address] ?[place] [address] ? ?Followup BPh with urinary retention ? ? ?HPI: ?Timothy Rich is a 66yo here for followup for BPH with urinary retention. IPSS 2 QOL 0. PVR 709cc. Urine stream strong. Nocturia 1x. No flank pain. No hematuria or UTI. PSA is normal. He remains on finasteride.  ? ? ?PMH: ?Past Medical History:  ?Diagnosis Date  ? Arthritis   ? BPH (benign prostatic hyperplasia)   ? Complication of anesthesia   ? low bp with hx replaced 2016  ? High cholesterol   ? Hypertension   ? ? ?Surgical History: ?Past Surgical History:  ?Procedure Laterality Date  ? APPENDECTOMY    ? BIOPSY  09/19/2021  ? Procedure: BIOPSY;  Surgeon: Montez Morita, Quillian Quince, MD;  [facility];  Service: Gastroenterology;;  ? COLONOSCOPY WITH PROPOFOL N/A 09/19/2021  ? Procedure: COLONOSCOPY WITH PROPOFOL;  Surgeon: [name], MD;  [facility];  Service: Gastroenterology;  Laterality: N/A;  7:30  ? KNEE ARTHROSCOPY    ? left knee  ? left hip arthroplasty Left   ? pilonidal cysts    ? as a teen ager  ? POLYPECTOMY  09/19/2021  ? Procedure: POLYPECTOMY;  Surgeon: [name], MD;  [facility];  Service: Gastroenterology;;  ? TOTAL HIP ARTHROPLASTY Right 07/04/2015  ? Procedure: TOTAL HIP ARTHROPLASTY ANTERIOR APPROACH;  Surgeon: Melrose Nakayama, MD;  Location: [place];  Service: Orthopedics;  Laterality: Right;  ? TRANSURETHRAL RESECTION OF PROSTATE N/A 10/28/2019  ? Procedure: TRANSURETHRAL RESECTION OF THE PROSTATE (TURP);  Surgeon: Cleon Gustin, MD;  Location: [facility];  Service: Urology;  Laterality: N/A;  ? VASECTOMY    ? ? ?Home Medications:  ?Allergies as of 02/06/2022   ? ?   Reactions  ? Simvastatin   ? Joint pain  ? ?  ? ?  ?Medication List  ?  ? ?  ? Accurate as of February 06, 2022  9:23 AM. If you have any questions, ask  your nurse or doctor.  ?  ?  ? ?  ? ?aspirin EC 81 MG tablet ?Take 81 mg by mouth every other day. ?  ?atorvastatin 80 MG tablet ?Commonly known as: LIPITOR ?Take 80 mg by mouth at bedtime. ?  ?diphenhydrAMINE 25 mg capsule ?Commonly known as: BENADRYL ?Take 25 mg by mouth daily as needed for allergies. ?  ?fenofibrate 160 MG tablet ?Take 160 mg by mouth at bedtime. ?  ?finasteride 5 MG tablet ?Commonly known as: PROSCAR ?Take 1 tablet (5 mg total) by mouth daily. ?  ?GLUCOSAMINE CHONDR 1500 COMPLX PO ?Take 2 tablets by mouth daily. ?  ?lisinopril-hydrochlorothiazide 20-12.5 MG tablet ?Commonly known as: ZESTORETIC ?Take 1 tablet by mouth daily. ?  ?MELATONIN PO ?Take 2 capsules by mouth at bedtime. ?  ?omeprazole 40 MG capsule ?Commonly known as: PRILOSEC ?Take 40 mg by mouth daily. ?  ?testosterone cypionate 200 MG/ML injection ?Commonly known as: DEPOTESTOSTERONE CYPIONATE ?Inject 200 mg into the muscle every 14 (fourteen) days. ?  ?[place] OP ?Place 1 drop into both eyes daily as needed (dry eyes). ?  ? ?  ? ? ?Allergies:  ?Allergies  ?Allergen Reactions  ? Simvastatin   ?  Joint pain  ? ? ?Family History: ?Family History  ?Problem Relation Age of Onset  ? Colon cancer Neg Hx   ? ? ?  Social History:  reports that he has never smoked. He has never used smokeless tobacco. He reports current alcohol use of about 7.0 standard drinks per week. He reports that he does not use drugs. ? ?ROS: ?All other review of systems were reviewed and are negative except what is noted above in HPI ? ?Physical Exam: ?BP 119/71   Pulse 66   ?Constitutional:  Alert and oriented, No acute distress. ?HEENT: [place] AT, moist mucus membranes.  Trachea midline, no masses. ?Cardiovascular: No clubbing, cyanosis, or edema. ?Respiratory: Normal respiratory effort, no increased work of breathing. ?GI: Abdomen is soft, nontender, nondistended, no abdominal masses ?GU: No CVA tenderness.  ?Lymph: No cervical or inguinal lymphadenopathy. ?Skin: No  rashes, bruises or suspicious lesions. ?Neurologic: Grossly intact, no focal deficits, moving all 4 extremities. ?Psychiatric: Normal mood and affect. ? ?Laboratory Data: ?Lab Results  ?Component Value Date  ? WBC 15.3 (H) 10/29/2019  ? HGB 10.9 (L) 10/29/2019  ? HCT 33.9 (L) 10/29/2019  ? MCV 97.1 10/29/2019  ? PLT 252 10/29/2019  ? ? ?Lab Results  ?Component Value Date  ? CREATININE 1.60 (H) 10/29/2019  ? ? ?No results found for: PSA ? ?No results found for: TESTOSTERONE ? ?No results found for: HGBA1C ? ?Urinalysis ?   ?Component Value Date/Time  ? COLORURINE YELLOW 06/23/2015 0944  ? APPEARANCEUR Clear 02/07/2021 0836  ? LABSPEC 1.015 06/23/2015 0944  ? PHURINE 6.0 06/23/2015 0944  ? GLUCOSEU Negative 02/07/2021 0836  ? [place] NEGATIVE 06/23/2015 0944  ? BILIRUBINUR Negative 02/07/2021 0836  ? [place] NEGATIVE 06/23/2015 0944  ? PROTEINUR Negative 02/07/2021 0836  ? [place] NEGATIVE 06/23/2015 0944  ? UROBILINOGEN 0.2 06/23/2015 0944  ? NITRITE Negative 02/07/2021 0836  ? NITRITE NEGATIVE 06/23/2015 0944  ? LEUKOCYTESUR Negative 02/07/2021 0836  ? ? ?Lab Results  ?Component Value Date  ? LABMICR Comment 02/07/2021  ? ? ?Pertinent Imaging: ? ?No results found for this or any previous visit. ? ?No results found for this or any previous visit. ? ?No results found for this or any previous visit. ? ?No results found for this or any previous visit. ? ?Results for orders placed during the hospital encounter of 07/05/20 ? ?US RENAL ? ?Narrative ?CLINICAL DATA:  Urinary retention. ? ?EXAM: ?RENAL / URINARY TRACT ULTRASOUND COMPLETE ? ?COMPARISON:  Apr 04, 2013. ? ?FINDINGS: ?Right Kidney: ? ?Renal measurements: 10.7 x 5.9 x 5.4 cm = volume: 180 mL. 2.6 cm ?simple cyst is seen in upper pole. Echogenicity within normal ?limits. No mass or hydronephrosis visualized. ? ?Left Kidney: ? ?Renal measurements: 12.0 x 7.1 x 5.1 cm = volume: 225 mL. ?Echogenicity within normal limits. No mass or  hydronephrosis ?visualized. ? ?Bladder: ? ?Appears normal for degree of bladder distention. Calculated prevoid ?volume of 588 mL. Calculated postvoid volume of 587 mL which is not ?significantly changed. ? ?Other: ? ?None. ? ?IMPRESSION: ?No significant renal abnormality is noted. ? ?Mild urinary bladder distention is noted which is not significantly ?changed on postvoid exam. ? ? ?Electronically Signed ?By: Marijo Conception M.D. ?On: 07/05/2020 09:17 ? ?No results found for this or any previous visit. ? ?No results found for this or any previous visit. ? ?No results found for this or any previous visit. ? ? ?Assessment & Plan:   ? ?1. Benign localized prostatic hyperplasia with lower urinary tract symptoms (LUTS) ?-Continue finasteride ?- BLADDER SCAN AMB NON-IMAGING ?- Urinalysis, Routine w reflex microscopic ? ?2. Acute retention of urine ?-patient to start double  voiding. Renal US, will call with results ?- BLADDER SCAN AMB NON-IMAGING ?- Urinalysis, Routine w reflex microscopic ? ? ?No follow-ups on file. ? ?Timothy Bang, MD ? ?[facility] ?  ?

## 2022-02-06 NOTE — Progress Notes (Signed)
post void residual=709 ?

## 2022-03-11 ENCOUNTER — Ambulatory Visit (HOSPITAL_COMMUNITY)
Admission: RE | Admit: 2022-03-11 | Discharge: 2022-03-11 | Disposition: A | Discharge status: Home / Self Care | Payer: Medicare Other | Source: Ambulatory Visit | Attending: Urology | Admitting: Urology

## 2022-03-11 DIAGNOSIS — N1339 Other hydronephrosis: Secondary | ICD-10-CM | POA: Diagnosis not present

## 2022-03-14 IMAGING — US US RENAL
1 series · 14 of 25 positions shown · non-contrast
Comparison: April 04, 2013.

CLINICAL DATA: Urinary retention.

EXAM:
RENAL / URINARY TRACT ULTRASOUND COMPLETE

[Series 1: us renal · 14 of 39 slices shown]
[im 1/39]
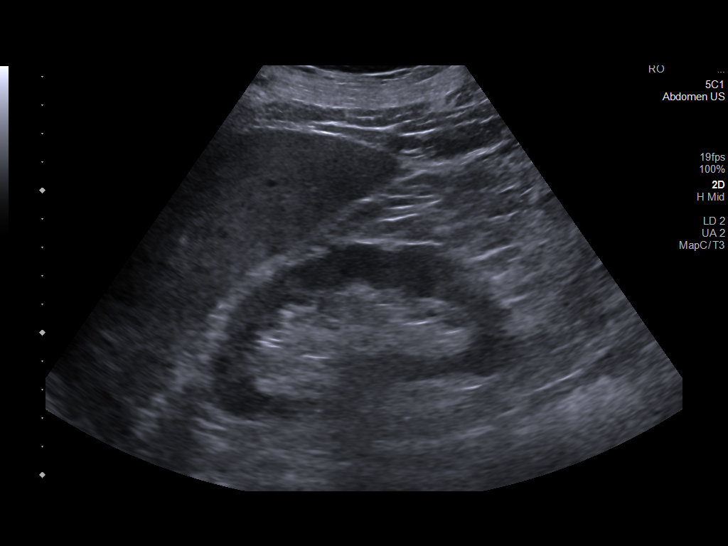
[im 4/39]
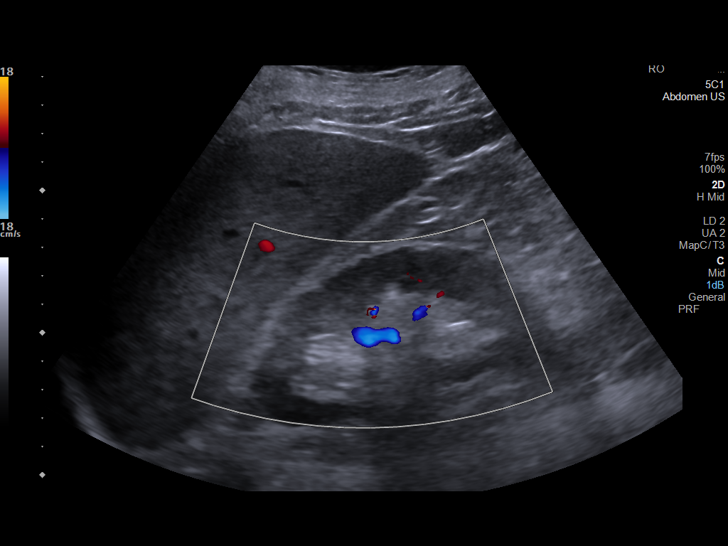
[im 7/39]
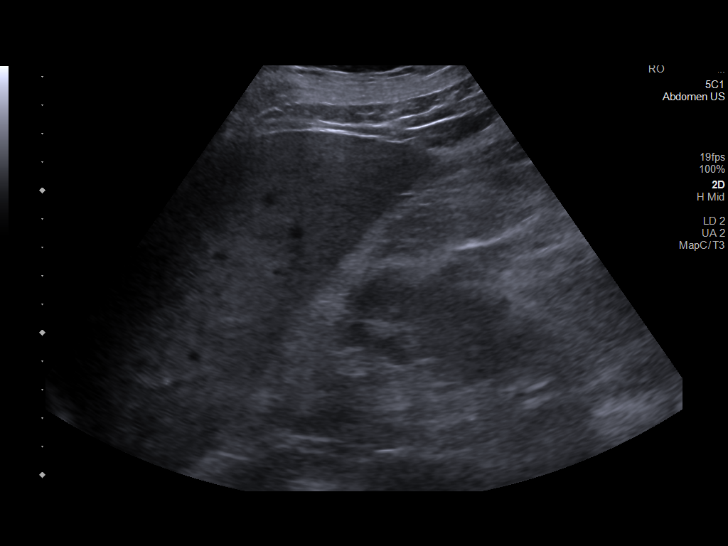
[im 10/39]
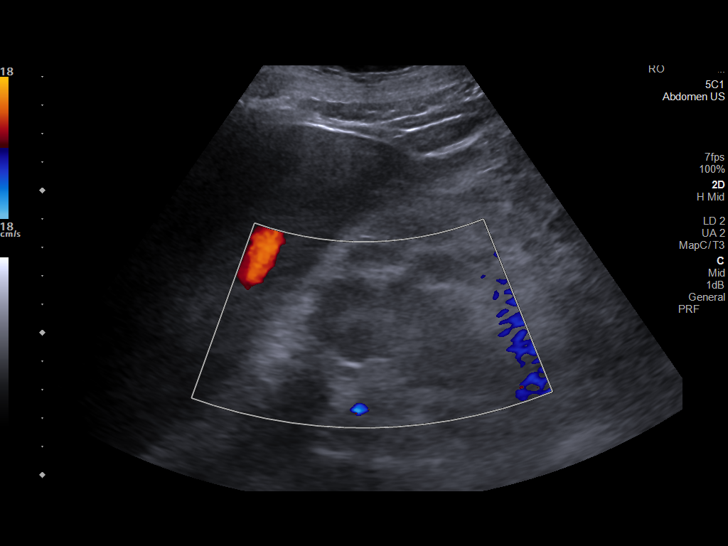
[im 13/39]
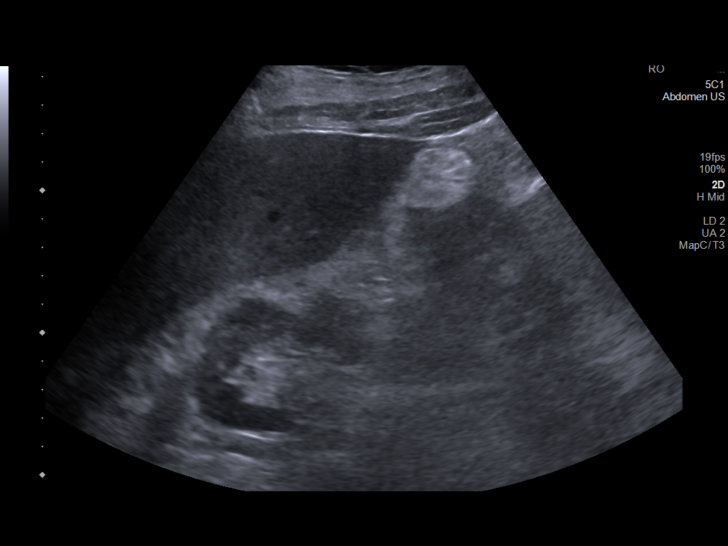
[im 15/39]
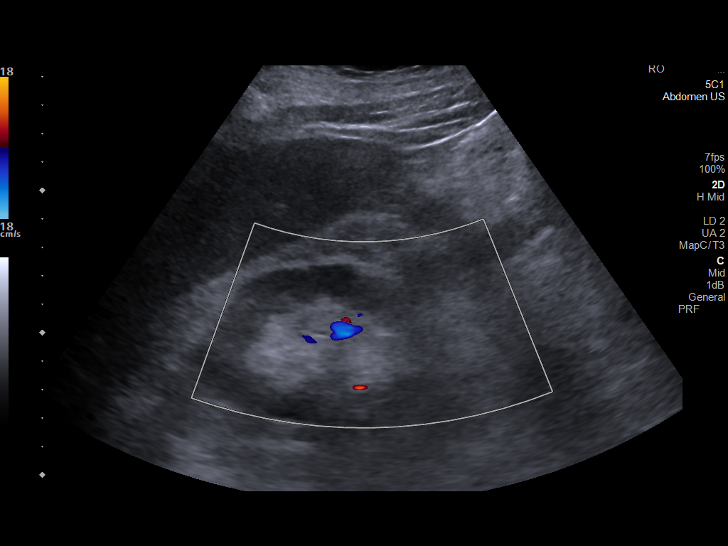
[im 18/39]
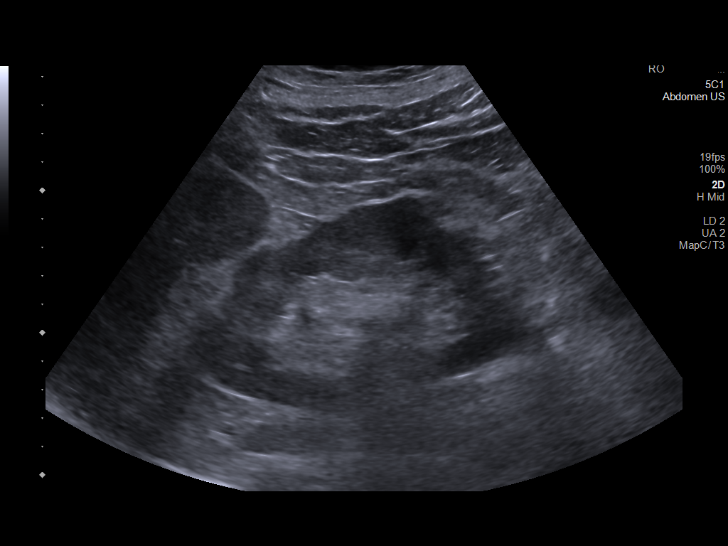
[im 21/39]
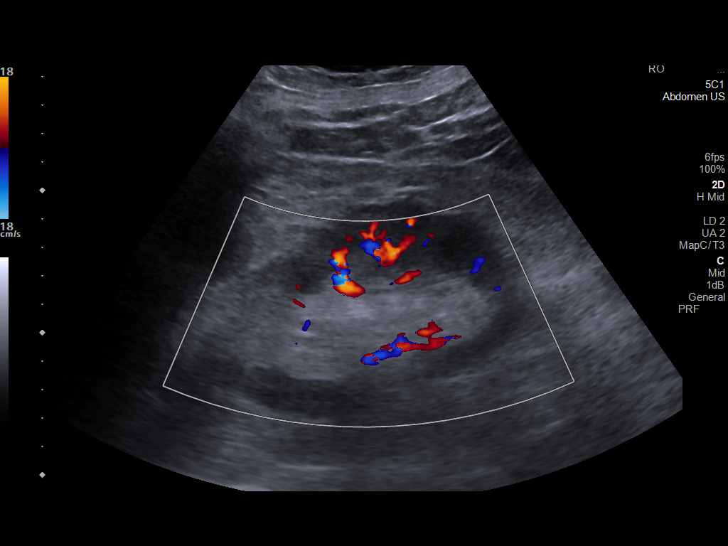
[im 24/39]
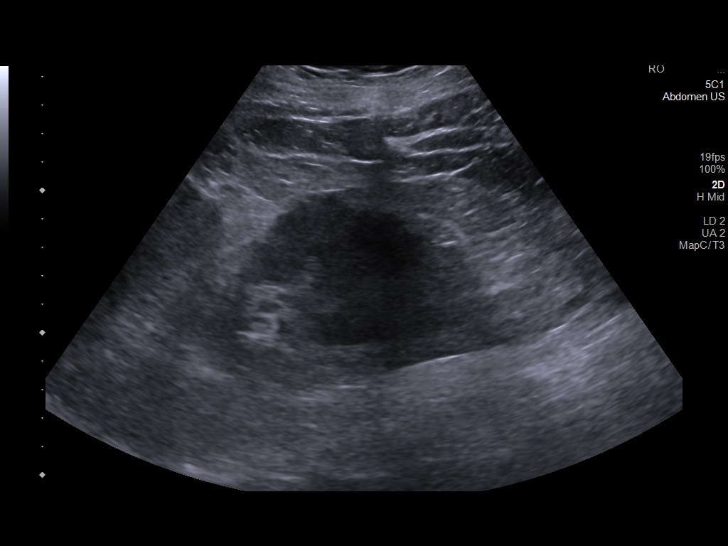
[im 26/39]
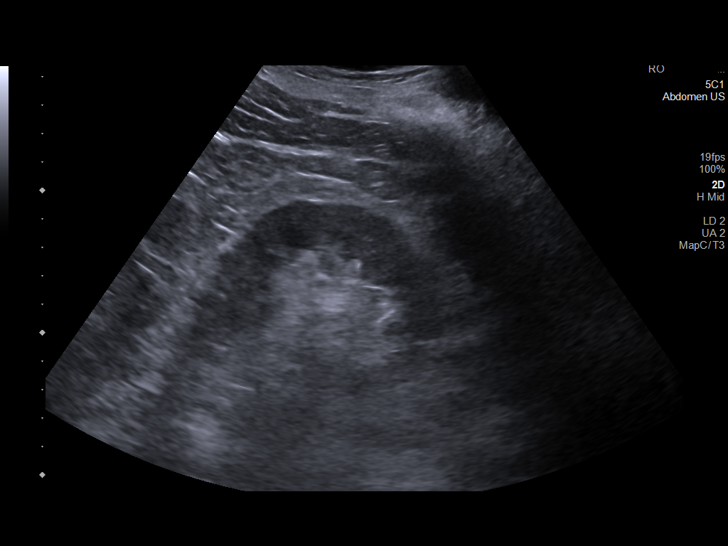
[im 29/39]
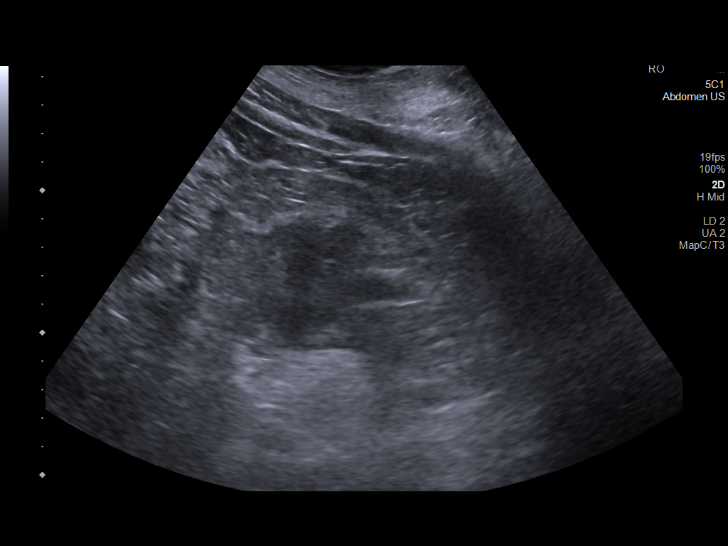
[im 32/39]
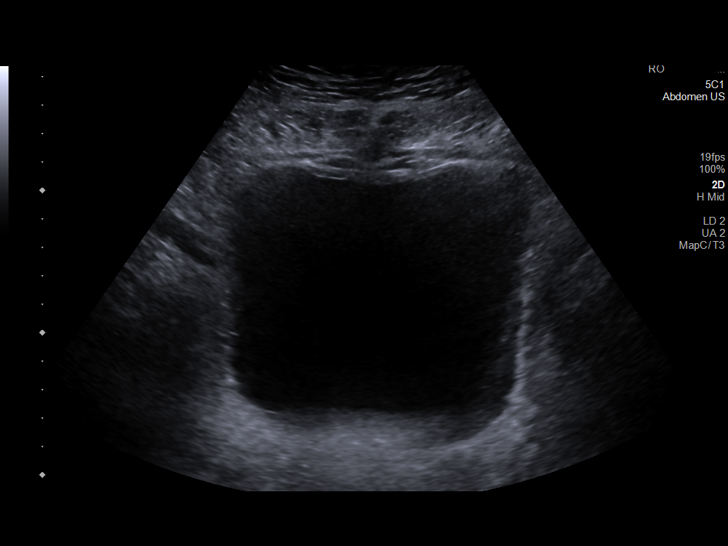
[im 35/39]
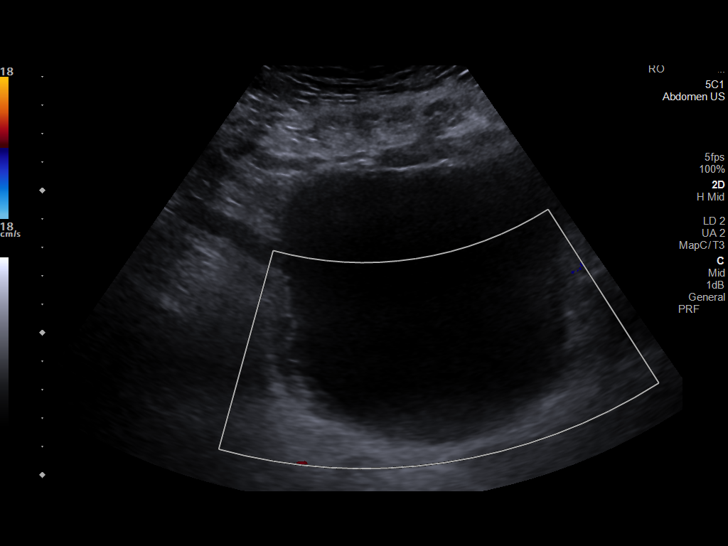
[im 39/39]
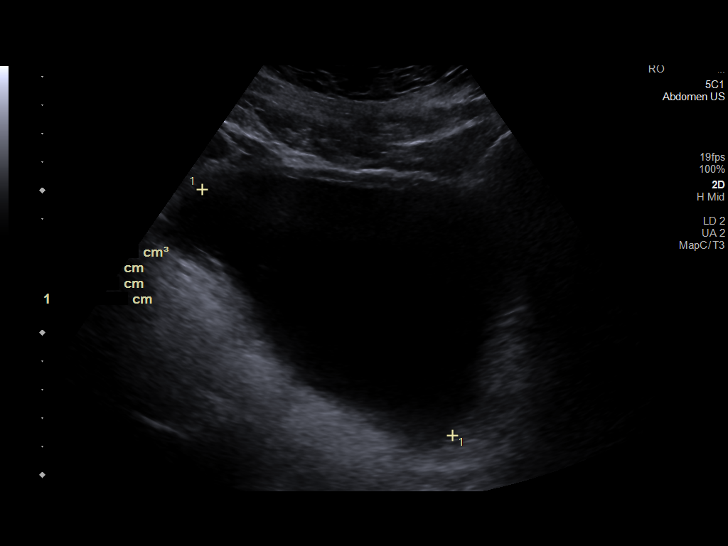

[14 of 25 positions shown; findings below may reference images not displayed]

FINDINGS: Right Kidney:

Renal measurements: 10.7 x 5.9 x 5.4 cm = volume: 180 mL. 2.6 cm
simple cyst is seen in upper pole. Echogenicity within normal
limits. No mass or hydronephrosis visualized.

Left Kidney:

Renal measurements: 12.0 x 7.1 x 5.1 cm = volume: 225 mL.
Echogenicity within normal limits. No mass or hydronephrosis
visualized.

Bladder:

Appears normal for degree of bladder distention. Calculated prevoid
volume of 588 mL. Calculated postvoid volume of 587 mL which is not
significantly changed.

Other:

None.
IMPRESSION: No significant renal abnormality is noted.

Mild urinary bladder distention is noted which is not significantly
changed on postvoid exam.

## 2023-01-29 ENCOUNTER — Ambulatory Visit: Payer: Medicare Other | Admitting: Urology

## 2023-01-29 DIAGNOSIS — N401 Enlarged prostate with lower urinary tract symptoms: Secondary | ICD-10-CM

## 2023-01-29 DIAGNOSIS — R338 Other retention of urine: Secondary | ICD-10-CM

## 2023-02-18 ENCOUNTER — Telehealth: Payer: Self-pay

## 2023-02-18 ENCOUNTER — Other Ambulatory Visit: Payer: Self-pay | Admitting: Urology

## 2023-02-18 ENCOUNTER — Other Ambulatory Visit: Payer: Self-pay

## 2023-02-18 MED ORDER — FINASTERIDE 5 MG PO TABS: 5.0000 mg | ORAL_TABLET | Freq: Every day | ORAL | 0 refills | Status: DC

## 2023-02-18 NOTE — Telephone Encounter (Signed)
Patient called advising [facility] submitted a PA request 4/5/ and 4/8 and wanted to follow up on that. He would like a call back once submitted.   Thank you

## 2023-02-18 NOTE — Telephone Encounter (Signed)
Refill sent in to cover until upcoming appointment

## 2023-03-12 ENCOUNTER — Ambulatory Visit (INDEPENDENT_AMBULATORY_CARE_PROVIDER_SITE_OTHER): Payer: Medicare Other | Admitting: Urology

## 2023-03-12 ENCOUNTER — Encounter: Payer: Self-pay | Admitting: Urology

## 2023-03-12 VITALS — BP 143/83 | HR 84

## 2023-03-12 DIAGNOSIS — R338 Other retention of urine: Secondary | ICD-10-CM | POA: Diagnosis not present

## 2023-03-12 DIAGNOSIS — N5201 Erectile dysfunction due to arterial insufficiency: Secondary | ICD-10-CM

## 2023-03-12 DIAGNOSIS — N401 Enlarged prostate with lower urinary tract symptoms: Secondary | ICD-10-CM | POA: Diagnosis not present

## 2023-03-12 LAB — BLADDER SCAN AMB NON-IMAGING: Scan Result: 459

## 2023-03-12 MED ORDER — TADALAFIL 20 MG PO TABS: 20.0000 mg | ORAL_TABLET | Freq: Every day | ORAL | 5 refills | Status: DC | PRN

## 2023-03-12 MED ORDER — FINASTERIDE 5 MG PO TABS: 5.0000 mg | ORAL_TABLET | Freq: Every day | ORAL | 3 refills | Status: DC

## 2023-03-12 NOTE — Progress Notes (Signed)
post void residual=459

## 2023-03-12 NOTE — Progress Notes (Signed)
03/12/2023 9:18 AM   Timothy Rich 1956/08/07 [id number]  Referring provider: Delorse Lek, FNP [address] [place],  [place] [address]  Followup BPh and incomplete emptying   HPI: Timothy Rich is a 24MW here for followup BPh and incomplete emptying. PVR 459 but patient did not leave a urine specimen. IPSS 3 QOl 0 after TURP. PSA normal. His new complaints today is difficulty getting and maintaining an erection. He previously tried sildenafil 80mg  which failed to give him a firm erection. Good exercise tolerance    PMH: Past Medical History:  Diagnosis Date   Arthritis    BPH (benign prostatic hyperplasia)    Complication of anesthesia    low bp with hx replaced 2016   High cholesterol    Hypertension     Surgical History: Past Surgical History:  Procedure Laterality Date   APPENDECTOMY     BIOPSY  09/19/2021   Procedure: BIOPSY;  Surgeon: Dolores Frame, MD;  [facility];  Service: Gastroenterology;;   COLONOSCOPY WITH PROPOFOL N/A 09/19/2021   Procedure: COLONOSCOPY WITH PROPOFOL;  Surgeon: Dolores Frame, MD;  [facility];  Service: Gastroenterology;  Laterality: N/A;  7:30   KNEE ARTHROSCOPY     left knee   left hip arthroplasty Left    pilonidal cysts     as a teen ager   POLYPECTOMY  09/19/2021   Procedure: POLYPECTOMY;  Surgeon: Dolores Frame, MD;  [facility];  Service: Gastroenterology;;   TOTAL HIP ARTHROPLASTY Right 07/04/2015   Procedure: TOTAL HIP ARTHROPLASTY ANTERIOR APPROACH;  Surgeon: Marcene Corning, MD;  Location: [facility];  Service: Orthopedics;  Laterality: Right;   TRANSURETHRAL RESECTION OF PROSTATE N/A 10/28/2019   Procedure: TRANSURETHRAL RESECTION OF THE PROSTATE (TURP);  Surgeon: Malen Gauze, MD;  Location: [facility];  Service: Urology;  Laterality: N/A;   VASECTOMY      Home Medications:  Allergies as of 03/12/2023       Reactions    Simvastatin    Joint pain        Medication List        Accurate as of Mar 12, 2023  9:18 AM. If you have any questions, ask your nurse or doctor.          aspirin EC 81 MG tablet Take 81 mg by mouth every other day.   atorvastatin 80 MG tablet Commonly known as: LIPITOR Take 80 mg by mouth at bedtime.   diphenhydrAMINE 25 mg capsule Commonly known as: BENADRYL Take 25 mg by mouth daily as needed for allergies.   fenofibrate 160 MG tablet Take 160 mg by mouth at bedtime.   finasteride 5 MG tablet Commonly known as: PROSCAR Take 1 tablet (5 mg total) by mouth daily.   finasteride 5 MG tablet Commonly known as: PROSCAR Take 1 tablet by mouth once daily   GLUCOSAMINE CHONDR 1500 COMPLX PO Take 2 tablets by mouth daily.   levothyroxine 50 MCG tablet Commonly known as: SYNTHROID Take 1 tablet by mouth daily.   lisinopril-hydrochlorothiazide 20-12.5 MG tablet Commonly known as: ZESTORETIC Take 1 tablet by mouth daily.   MELATONIN PO Take 2 capsules by mouth at bedtime.   omeprazole 40 MG capsule Commonly known as: PRILOSEC Take 40 mg by mouth daily.   testosterone cypionate 200 MG/ML injection Commonly known as: DEPOTESTOSTERONE CYPIONATE Inject 200 mg into the muscle every 14 (fourteen) days.   VISINE OP Place 1 drop  into both eyes daily as needed (dry eyes).        Allergies:  Allergies  Allergen Reactions   Simvastatin     Joint pain    Family History: Family History  Problem Relation Age of Onset   Colon cancer Neg Hx     Social History:  reports that he has never smoked. He has never used smokeless tobacco. He reports current alcohol use of about 7.0 standard drinks of alcohol per week. He reports that he does not use drugs.  ROS: All other review of systems were reviewed and are negative except what is noted above in HPI  Physical Exam: BP (!) 143/83   Pulse 84   Constitutional:  Alert and oriented, Rich acute distress. HEENT: Timothy Rich  AT, moist mucus membranes.  Trachea midline, Rich masses. Cardiovascular: Rich clubbing, cyanosis, or edema. Respiratory: Normal respiratory effort, Rich increased work of breathing. GI: Abdomen is soft, nontender, nondistended, Rich abdominal masses GU: Rich CVA tenderness.  Lymph: Rich cervical or inguinal lymphadenopathy. Skin: Rich rashes, bruises or suspicious lesions. Neurologic: Grossly intact, Rich focal deficits, moving all 4 extremities. Psychiatric: Normal mood and affect.  Laboratory Data: Lab Results  Component Value Date   WBC 15.3 (H) 10/29/2019   HGB 10.9 (L) 10/29/2019   HCT 33.9 (L) 10/29/2019   MCV 97.1 10/29/2019   PLT 252 10/29/2019    Lab Results  Component Value Date   CREATININE 1.60 (H) 10/29/2019    Rich results found for: "PSA"  Rich results found for: "TESTOSTERONE"  Rich results found for: "HGBA1C"  Urinalysis    Component Value Date/Time   COLORURINE YELLOW 06/23/2015 0944   APPEARANCEUR Clear 02/06/2022 0852   LABSPEC 1.015 06/23/2015 0944   PHURINE 6.0 06/23/2015 0944   GLUCOSEU Negative 02/06/2022 0852   HGBUR NEGATIVE 06/23/2015 0944   BILIRUBINUR Negative 02/06/2022 0852   KETONESUR NEGATIVE 06/23/2015 0944   PROTEINUR Negative 02/06/2022 0852   PROTEINUR NEGATIVE 06/23/2015 0944   UROBILINOGEN 0.2 06/23/2015 0944   NITRITE Negative 02/06/2022 0852   NITRITE NEGATIVE 06/23/2015 0944   LEUKOCYTESUR Negative 02/06/2022 0852    Lab Results  Component Value Date   LABMICR Comment 02/07/2021    Pertinent Imaging:  Rich results found for this or any previous visit.  Rich results found for this or any previous visit.  Rich results found for this or any previous visit.  Rich results found for this or any previous visit.  Results for orders placed in visit on 02/06/22  Ultrasound renal complete  Narrative CLINICAL DATA:  Urinary retention.  EXAM: RENAL / URINARY TRACT ULTRASOUND COMPLETE  COMPARISON:  July 05, 2020  FINDINGS: Right  Kidney:  Renal measurements: 12.6 x 6.4 x 5.9 cm = volume: 146.5 mL. Echogenicity within normal limits. Rich mass or hydronephrosis visualized.  Left Kidney:  Renal measurements: 12.1 x 7.4 x 5.8 cm = volume: 269.5 mL. Echogenicity within normal limits. Rich mass or hydronephrosis visualized.  Bladder:  Appears normal for degree of bladder distention. Bilateral ureteral jets are identified. Prevoid volume 360.59 cc, postvoid volume 217.6 cc.  Other:  None.  IMPRESSION: Normal kidneys.  Pre and postvoid volume in the bladder suggest urinary retention.   Electronically Signed By: Sherian Rein M.D. On: 03/11/2022 14:51  Rich valid procedures specified. Rich results found for this or any previous visit.  Rich results found for this or any previous visit.   Assessment & Plan:    1. Benign localized prostatic hyperplasia with lower  urinary tract symptoms (LUTS) -improved after TURP, continue finasteride 5mg  daily - BLADDER SCAN AMB NON-IMAGING - Urinalysis, Routine w reflex microscopic  2.  retention of urine -patient to double void - BLADDER SCAN AMB NON-IMAGING  3. Erectile dysfunction -tadalafil 20mg  prn   Rich follow-ups on file.  Wilkie Aye, MD  [facility] [place]

## 2023-03-12 NOTE — Patient Instructions (Signed)
Erectile Dysfunction ?Erectile dysfunction (ED) is the inability to get or keep an erection in order to have sexual intercourse. ED is considered a symptom of an underlying disorder and is not considered a disease. ED may include: ?Inability to get an erection. ?Lack of enough hardness of the erection to allow penetration. ?Loss of erection before sex is finished. ?What are the causes? ?This condition may be caused by: ?Physical causes, such as: ?Artery problems. This may include heart disease, high blood pressure, atherosclerosis, and diabetes. ?Hormonal problems, such as low testosterone. ?Obesity. ?Nerve problems. This may include back or pelvic injuries, multiple sclerosis, Parkinson's disease, spinal cord injury, and stroke. ?Certain medicines, such as: ?Pain relievers. ?Antidepressants. ?Blood pressure medicines and water pills (diuretics). ?Cancer medicines. ?Antihistamines. ?Muscle relaxants. ?Lifestyle factors, such as: ?Use of drugs such as marijuana, cocaine, or opioids. ?Excessive use of alcohol. ?Smoking. ?Lack of physical activity or exercise. ?Psychological causes, such as: ?Anxiety or stress. ?Sadness or depression. ?Exhaustion. ?Fear about sexual performance. ?Guilt. ?What are the signs or symptoms? ?Symptoms of this condition include: ?Inability to get an erection. ?Lack of enough hardness of the erection to allow penetration. ?Loss of the erection before sex is finished. ?Sometimes having normal erections, but with frequent unsatisfactory episodes. ?Low sexual satisfaction in either partner due to erection problems. ?A curved penis occurring with erection. The curve may cause pain, or the penis may be too curved to allow for intercourse. ?Never having nighttime or morning erections. ?How is this diagnosed? ?This condition is often diagnosed by: ?Performing a physical exam to find other diseases or specific problems with the penis. ?Asking you detailed questions about the problem. ?Doing tests,  such as: ?Blood tests to check for diabetes mellitus or high cholesterol, or to measure hormone levels. ?Other tests to check for underlying health conditions. ?An ultrasound exam to check for scarring. ?A test to check blood flow to the penis. ?Doing a sleep study at home to measure nighttime erections. ?How is this treated? ?This condition may be treated by: ?Medicines, such as: ?Medicine taken by mouth to help you achieve an erection (oral medicine). ?Hormone replacement therapy to replace low testosterone levels. ?Medicine that is injected into the penis. Your health care provider may instruct you how to give yourself these injections at home. ?Medicine that is delivered with a short applicator tube. The tube is inserted into the opening at the tip of the penis, which is the opening of the urethra. A tiny pellet of medicine is put in the urethra. The pellet dissolves and enhances erectile function. This is also called MUSE (medicated urethral system for erections) therapy. ?Vacuum pump. This is a pump with a ring on it. The pump and ring are placed on the penis and used to create pressure that helps the penis become erect. ?Penile implant surgery. In this procedure, you may receive: ?An inflatable implant. This consists of cylinders, a pump, and a reservoir. The cylinders can be inflated with a fluid that helps to create an erection, and they can be deflated after intercourse. ?A semi-rigid implant. This consists of two silicone rubber rods. The rods provide some rigidity. They are also flexible, so the penis can both curve downward in its normal position and become straight for sexual intercourse. ?Blood vessel surgery to improve blood flow to the penis. During this procedure, a blood vessel from a different part of the body is placed into the penis to allow blood to flow around (bypass) damaged or blocked blood vessels. ?Lifestyle changes,   such as exercising more, losing weight, and quitting smoking. ?Follow  these instructions at home: ?Medicines ? ?Take over-the-counter and prescription medicines only as told by your health care provider. Do not increase the dosage without first discussing it with your health care provider. ?If you are using self-injections, do injections as directed by your health care provider. Make sure you avoid any veins that are on the surface of the penis. After giving an injection, apply pressure to the injection site for 5 minutes. ?Talk to your health care provider about how to prevent headaches while taking ED medicines. These medicines may cause a sudden headache due to the increase in blood flow in your body. ?General instructions ?Exercise regularly, as directed by your health care provider. Work with your health care provider to lose weight, if needed. ?Do not use any products that contain nicotine or tobacco. These products include cigarettes, chewing tobacco, and vaping devices, such as e-cigarettes. If you need help quitting, ask your health care provider. ?Before using a vacuum pump, read the instructions that come with the pump and discuss any questions with your health care provider. ?Keep all follow-up visits. This is important. ?Contact a health care provider if: ?You feel nauseous. ?You are vomiting. ?You get sudden headaches while taking ED medicines. ?You have any concerns about your sexual health. ?Get help right away if: ?You are taking oral or injectable medicines and you have an erection that lasts longer than 4 hours. If your health care provider is unavailable, go to the nearest emergency room for evaluation. An erection that lasts much longer than 4 hours can result in permanent damage to your penis. ?You have severe pain in your groin or abdomen. ?You develop redness or severe swelling of your penis. ?You have redness spreading at your groin or lower abdomen. ?You are unable to urinate. ?You experience chest pain or a rapid heartbeat (palpitations) after taking oral  medicines. ?These symptoms may represent a serious problem that is an emergency. Do not wait to see if the symptoms will go away. Get medical help right away. Call your local emergency services (911 in the [place] Do not drive yourself to the hospital. ?Summary ?Erectile dysfunction (ED) is the inability to get or keep an erection during sexual intercourse. ?This condition is diagnosed based on a physical exam, your symptoms, and tests to determine the cause. Treatment varies depending on the cause and may include medicines, hormone therapy, surgery, or a vacuum pump. ?You may need follow-up visits to make sure that you are using your medicines or devices correctly. ?Get help right away if you are taking or injecting medicines and you have an erection that lasts longer than 4 hours. ?This information is not intended to replace advice given to you by your health care provider. Make sure you discuss any questions you have with your health care provider. ?Document Revised: 01/24/2021 Document Reviewed: 01/24/2021 ?Elsevier Patient Education ? 2023 [employer] ? ?

## 2024-03-17 ENCOUNTER — Ambulatory Visit: Payer: Medicare Other | Admitting: Urology

## 2024-04-30 ENCOUNTER — Ambulatory Visit: Admitting: Urology

## 2024-04-30 ENCOUNTER — Encounter: Payer: Self-pay | Admitting: Urology

## 2024-04-30 VITALS — BP 124/73 | HR 66

## 2024-04-30 DIAGNOSIS — R338 Other retention of urine: Secondary | ICD-10-CM | POA: Diagnosis not present

## 2024-04-30 DIAGNOSIS — N401 Enlarged prostate with lower urinary tract symptoms: Secondary | ICD-10-CM

## 2024-04-30 DIAGNOSIS — N1339 Other hydronephrosis: Secondary | ICD-10-CM

## 2024-04-30 DIAGNOSIS — N5201 Erectile dysfunction due to arterial insufficiency: Secondary | ICD-10-CM | POA: Diagnosis not present

## 2024-04-30 LAB — URINALYSIS, ROUTINE W REFLEX MICROSCOPIC
Bilirubin, UA: NEGATIVE
Glucose, UA: NEGATIVE
Ketones, UA: NEGATIVE
Leukocytes,UA: NEGATIVE
Nitrite, UA: NEGATIVE
Protein,UA: NEGATIVE
RBC, UA: NEGATIVE
Specific Gravity, UA: 1.02 (ref 1.005–1.030)
Urobilinogen, Ur: 2 mg/dL — ABNORMAL HIGH (ref 0.2–1.0)
pH, UA: 6 (ref 5.0–7.5)

## 2024-04-30 LAB — BLADDER SCAN AMB NON-IMAGING: Scan Result: 739

## 2024-04-30 MED ORDER — FINASTERIDE 5 MG PO TABS: 5.0000 mg | ORAL_TABLET | Freq: Every day | ORAL | 3 refills | Status: AC

## 2024-04-30 MED ORDER — TADALAFIL 20 MG PO TABS: 20.0000 mg | ORAL_TABLET | Freq: Every day | ORAL | 5 refills | Status: AC | PRN

## 2024-04-30 NOTE — Patient Instructions (Signed)

## 2024-04-30 NOTE — Progress Notes (Signed)
 post void residual=[id number]

## 2024-04-30 NOTE — Progress Notes (Signed)
 04/30/2024 10:36 AM   Henretta Lodge 11-Aug-1956 [id number]  Referring provider: Daphney Eans, FNP [address] [place],  [place] [address]  Followup BPH   HPI: Mr Durio is a 68yo here for followup for BPH and incomplete emptying. PVR 739cc. IPSS 4 QOL 1 after TURP. Urine stream is strong. No straining to urinate. Nocturia 1x. No urinary frequency He is on finasteride  5mg  daily. He remains on IM testosterone. No recent PSA. He uses tadalafil  20mg  prn with good results.    PMH: Past Medical History:  Diagnosis Date   Arthritis    BPH (benign prostatic hyperplasia)    Complication of anesthesia    low bp with hx replaced 2016   High cholesterol    Hypertension     Surgical History: Past Surgical History:  Procedure Laterality Date   APPENDECTOMY     BIOPSY  09/19/2021   Procedure: BIOPSY;  Surgeon: Urban Garden, MD;  Location: [facility];  Service: Gastroenterology;;   COLONOSCOPY WITH PROPOFOL  N/A 09/19/2021   Procedure: COLONOSCOPY WITH PROPOFOL ;  Surgeon: Urban Garden, MD;  Location: [facility];  Service: Gastroenterology;  Laterality: N/A;  7:30   KNEE ARTHROSCOPY     left knee   left hip arthroplasty Left    pilonidal cysts     as a teen ager   POLYPECTOMY  09/19/2021   Procedure: POLYPECTOMY;  Surgeon: Urban Garden, MD;  Location: [facility];  Service: Gastroenterology;;   TOTAL HIP ARTHROPLASTY Right 07/04/2015   Procedure: TOTAL HIP ARTHROPLASTY ANTERIOR APPROACH;  Surgeon: Dayne Even, MD;  Location: [facility];  Service: Orthopedics;  Laterality: Right;   TRANSURETHRAL RESECTION OF PROSTATE N/A 10/28/2019   Procedure: TRANSURETHRAL RESECTION OF THE PROSTATE (TURP);  Surgeon: Marco Severs, MD;  Location: [facility];  Service: Urology;  Laterality: N/A;   VASECTOMY      Home Medications:  Allergies as of 04/30/2024       Reactions   Simvastatin    Joint pain        Medication  List        Accurate as of April 30, 2024 10:36 AM. If you have any questions, ask your nurse or doctor.          STOP taking these medications    diphenhydrAMINE  25 mg capsule Commonly known as: BENADRYL    MELATONIN PO       TAKE these medications    aspirin  EC 81 MG tablet Take 81 mg by mouth every other day.   atorvastatin  80 MG tablet Commonly known as: LIPITOR  Take 80 mg by mouth at bedtime.   fenofibrate  160 MG tablet Take 160 mg by mouth at bedtime.   finasteride  5 MG tablet Commonly known as: PROSCAR  Take 1 tablet (5 mg total) by mouth daily.   GLUCOSAMINE CHONDR 1500 COMPLX PO Take 2 tablets by mouth daily.   levothyroxine 50 MCG tablet Commonly known as: SYNTHROID Take 1 tablet by mouth daily.   lisinopril -hydrochlorothiazide  20-12.5 MG tablet Commonly known as: ZESTORETIC  Take 1 tablet by mouth daily.   omeprazole 40 MG capsule Commonly known as: PRILOSEC Take 40 mg by mouth daily.   SEMAGLUTIDE (2 MG/DOSE) [place]   tadalafil  20 MG tablet Commonly known as: CIALIS  Take 1 tablet (20 mg total) by mouth daily as needed.   testosterone cypionate 200 MG/ML injection Commonly known as: DEPOTESTOSTERONE CYPIONATE Inject 200 mg into the muscle every 14 (fourteen) days.  VISINE OP Place 1 drop into both eyes daily as needed (dry eyes).        Allergies:  Allergies  Allergen Reactions   Simvastatin     Joint pain    Family History: Family History  Problem Relation Age of Onset   Colon cancer Neg Hx     Social History:  reports that he has never smoked. He has never used smokeless tobacco. He reports current alcohol use of about 7.0 standard drinks of alcohol per week. He reports that he does not use drugs.  ROS: All other review of systems were reviewed and are negative except what is noted above in HPI  Physical Exam: BP 124/73   Pulse 66   Constitutional:  Alert and oriented, No acute distress. HEENT: [place] AT, moist mucus  membranes.  Trachea midline, no masses. Cardiovascular: No clubbing, cyanosis, or edema. Respiratory: Normal respiratory effort, no increased work of breathing. GI: Abdomen is soft, nontender, nondistended, no abdominal masses GU: No CVA tenderness.  Lymph: No cervical or inguinal lymphadenopathy. Skin: No rashes, bruises or suspicious lesions. Neurologic: Grossly intact, no focal deficits, moving all 4 extremities. Psychiatric: Normal mood and affect.  Laboratory Data: Lab Results  Component Value Date   WBC 15.3 (H) 10/29/2019   HGB 10.9 (L) 10/29/2019   HCT 33.9 (L) 10/29/2019   MCV 97.1 10/29/2019   PLT 252 10/29/2019    Lab Results  Component Value Date   CREATININE 1.60 (H) 10/29/2019    No results found for: PSA  No results found for: TESTOSTERONE  No results found for: HGBA1C  Urinalysis    Component Value Date/Time   COLORURINE YELLOW 06/23/2015 0944   APPEARANCEUR Clear 02/06/2022 0852   LABSPEC 1.015 06/23/2015 0944   PHURINE 6.0 06/23/2015 0944   GLUCOSEU Negative 02/06/2022 0852   HGBUR NEGATIVE 06/23/2015 0944   BILIRUBINUR Negative 02/06/2022 0852   KETONESUR NEGATIVE 06/23/2015 0944   PROTEINUR Negative 02/06/2022 0852   PROTEINUR NEGATIVE 06/23/2015 0944   UROBILINOGEN 0.2 06/23/2015 0944   NITRITE Negative 02/06/2022 0852   NITRITE NEGATIVE 06/23/2015 0944   LEUKOCYTESUR Negative 02/06/2022 0852    Lab Results  Component Value Date   LABMICR Comment 02/07/2021    Pertinent Imaging:  No results found for this or any previous visit.  No results found for this or any previous visit.  No results found for this or any previous visit.  No results found for this or any previous visit.  Results for orders placed in visit on 02/06/22  Ultrasound renal complete  Narrative CLINICAL DATA:  Urinary retention.  EXAM: RENAL / URINARY TRACT ULTRASOUND COMPLETE  COMPARISON:  July 05, 2020  FINDINGS: Right Kidney:  Renal  measurements: 12.6 x 6.4 x 5.9 cm = volume: 146.5 mL. Echogenicity within normal limits. No mass or hydronephrosis visualized.  Left Kidney:  Renal measurements: 12.1 x 7.4 x 5.8 cm = volume: 269.5 mL. Echogenicity within normal limits. No mass or hydronephrosis visualized.  Bladder:  Appears normal for degree of bladder distention. Bilateral ureteral jets are identified. Prevoid volume 360.59 cc, postvoid volume 217.6 cc.  Other:  None.  IMPRESSION: Normal kidneys.  Pre and postvoid volume in the bladder suggest urinary retention.   Electronically Signed By: Anna Barnes M.D. On: 03/11/2022 14:51  No results found for this or any previous visit.  No results found for this or any previous visit.  No results found for this or any previous visit.   Assessment & Plan:  1. Benign localized prostatic hyperplasia with lower urinary tract symptoms (LUTS) (Primary) -continue finasteride  5mg  daily - Urinalysis, Routine w reflex microscopic - BLADDER SCAN AMB NON-IMAGING  2. Acute retention of urine -renal US  to assess for hydronephrosis  3. Erectile dysfunction due to arterial insufficiency -continue tadalafil  20mg  prn   No follow-ups on file.  Johnie Nailer, MD  [facility] [place]

## 2024-05-07 ENCOUNTER — Ambulatory Visit (HOSPITAL_COMMUNITY)
Admission: RE | Admit: 2024-05-07 | Discharge: 2024-05-07 | Disposition: A | Discharge status: Home / Self Care | Source: Ambulatory Visit | Attending: Urology

## 2024-05-07 DIAGNOSIS — N1339 Other hydronephrosis: Secondary | ICD-10-CM | POA: Diagnosis present

## 2024-05-07 DIAGNOSIS — R338 Other retention of urine: Secondary | ICD-10-CM | POA: Insufficient documentation

## 2024-05-18 ENCOUNTER — Ambulatory Visit: Payer: Self-pay | Admitting: Urology

## 2024-12-06 ENCOUNTER — Emergency Department (HOSPITAL_COMMUNITY)
Admission: EM | Admit: 2024-12-06 | Discharge: 2024-12-06 | Disposition: A | Discharge status: Home / Self Care | Attending: Emergency Medicine | Admitting: Emergency Medicine

## 2024-12-06 ENCOUNTER — Emergency Department (HOSPITAL_COMMUNITY)

## 2024-12-06 ENCOUNTER — Other Ambulatory Visit: Payer: Self-pay

## 2024-12-06 ENCOUNTER — Encounter (HOSPITAL_COMMUNITY): Payer: Self-pay

## 2024-12-06 DIAGNOSIS — I10 Essential (primary) hypertension: Secondary | ICD-10-CM | POA: Insufficient documentation

## 2024-12-06 DIAGNOSIS — R42 Dizziness and giddiness: Secondary | ICD-10-CM | POA: Insufficient documentation

## 2024-12-06 DIAGNOSIS — Z79899 Other long term (current) drug therapy: Secondary | ICD-10-CM | POA: Insufficient documentation

## 2024-12-06 DIAGNOSIS — R0689 Other abnormalities of breathing: Secondary | ICD-10-CM | POA: Diagnosis not present

## 2024-12-06 DIAGNOSIS — Z7982 Long term (current) use of aspirin: Secondary | ICD-10-CM | POA: Diagnosis not present

## 2024-12-06 DIAGNOSIS — R109 Unspecified abdominal pain: Secondary | ICD-10-CM | POA: Insufficient documentation

## 2024-12-06 DIAGNOSIS — D751 Secondary polycythemia: Secondary | ICD-10-CM | POA: Insufficient documentation

## 2024-12-06 LAB — COMPREHENSIVE METABOLIC PANEL WITH GFR
ALT: 20 U/L (ref 0–44)
AST: 28 U/L (ref 15–41)
Albumin: 4.6 g/dL (ref 3.5–5.0)
Alkaline Phosphatase: 60 U/L (ref 38–126)
Anion gap: 13 (ref 5–15)
BUN: 20 mg/dL (ref 8–23)
CO2: 27 mmol/L (ref 22–32)
Calcium: 10.2 mg/dL (ref 8.9–10.3)
Chloride: 100 mmol/L (ref 98–111)
Creatinine, Ser: 1.36 mg/dL — ABNORMAL HIGH (ref 0.61–1.24)
GFR, Estimated: 57 mL/min — ABNORMAL LOW
Glucose, Bld: 116 mg/dL — ABNORMAL HIGH (ref 70–99)
Potassium: 3.9 mmol/L (ref 3.5–5.1)
Sodium: 140 mmol/L (ref 135–145)
Total Bilirubin: 0.6 mg/dL (ref 0.0–1.2)
Total Protein: 7.4 g/dL (ref 6.5–8.1)

## 2024-12-06 LAB — URINALYSIS, ROUTINE W REFLEX MICROSCOPIC
Bilirubin Urine: NEGATIVE
Glucose, UA: NEGATIVE mg/dL
Hgb urine dipstick: NEGATIVE
Ketones, ur: NEGATIVE mg/dL
Leukocytes,Ua: NEGATIVE
Nitrite: NEGATIVE
Protein, ur: NEGATIVE mg/dL
Specific Gravity, Urine: 1.014 (ref 1.005–1.030)
pH: 5 (ref 5.0–8.0)

## 2024-12-06 LAB — CBC
HCT: 53.1 % — ABNORMAL HIGH (ref 39.0–52.0)
Hemoglobin: 17.5 g/dL — ABNORMAL HIGH (ref 13.0–17.0)
MCH: 30.6 pg (ref 26.0–34.0)
MCHC: 33 g/dL (ref 30.0–36.0)
MCV: 92.8 fL (ref 80.0–100.0)
Platelets: 243 10*3/uL (ref 150–400)
RBC: 5.72 MIL/uL (ref 4.22–5.81)
RDW: 13.2 % (ref 11.5–15.5)
WBC: 7.8 10*3/uL (ref 4.0–10.5)
nRBC: 0 % (ref 0.0–0.2)

## 2024-12-06 LAB — LIPASE, BLOOD: Lipase: 67 U/L — ABNORMAL HIGH (ref 11–51)

## 2024-12-06 LAB — PRO BRAIN NATRIURETIC PEPTIDE: Pro Brain Natriuretic Peptide: <50 pg/mL

## 2024-12-06 LAB — TROPONIN T, HIGH SENSITIVITY
Troponin T High Sensitivity: 14 ng/L (ref 0–19)
Troponin T High Sensitivity: 14 ng/L (ref 0–19)

## 2024-12-06 LAB — MAGNESIUM: Magnesium: 1.9 mg/dL (ref 1.7–2.4)

## 2024-12-06 MED ORDER — IOHEXOL 300 MG/ML  SOLN: 100.0000 mL | Freq: Once | INTRAMUSCULAR | Status: DC | PRN

## 2024-12-06 MED ORDER — IOHEXOL 350 MG/ML SOLN
75.0000 mL | Freq: Once | INTRAVENOUS | Status: AC | PRN
  Administered 2024-12-06: 75 mL via INTRAVENOUS

## 2024-12-06 NOTE — ED Provider Notes (Signed)
 " [facility] AT [facility] Provider Note   CSN: [id number] Arrival date & time: 12/06/24  1247     Patient presents with: Abdominal Pain   Timothy Rich is a 69 y.o. male.    Abdominal Pain Patient presents for intermittent episodes of lightheadedness.  Medical history includes arthritis, BPH, HLD, HTN.  For the past month, he has noticed intermittent episodes that he describes as feeling lightheaded.  When these occur, they last for approximately 5 minutes.  They are not related from position changes.  He was seen by his PCP who felt they may be related to anxiety.  He was started on low-dose Celexa.  He has been on this for the past 2 weeks.  He has continued episodes.  He also has noticed he fullness and gurgling sensation in his abdomen.  He has been having frequent bowel movements.  Currently, he is asymptomatic.     Prior to Admission medications  Medication Sig Start Date End Date Taking? Authorizing Provider  aspirin  EC 81 MG tablet Take 81 mg by mouth every other day.    [provider]  atorvastatin  (LIPITOR ) 80 MG tablet Take 80 mg by mouth at bedtime.     [provider]  citalopram (CELEXA) 10 MG tablet Take 10 mg by mouth daily.    [provider]  fenofibrate  160 MG tablet Take 160 mg by mouth at bedtime.     [provider]  finasteride  (PROSCAR ) 5 MG tablet Take 1 tablet (5 mg total) by mouth daily. 04/30/24   McKenzie, Belvie CROME, MD  Glucosamine-Chondroit-Vit C-Mn (GLUCOSAMINE CHONDR 1500 COMPLX PO) Take 2 tablets by mouth daily.    [provider]  levothyroxine (SYNTHROID) 50 MCG tablet Take 1 tablet by mouth daily.    [provider]  lisinopril -hydrochlorothiazide  (ZESTORETIC ) 20-12.5 MG tablet Take 1 tablet by mouth daily.    [provider]  omeprazole (PRILOSEC) 40 MG capsule Take 40 mg by mouth daily. Patient not taking: Reported on 02/06/2022 07/26/20   [provider]  SEMAGLUTIDE, 2 MG/DOSE, [place]     [provider]  tadalafil  (CIALIS ) 20 MG tablet Take 1 tablet (20 mg total) by mouth daily as needed. 04/30/24   McKenzie, Belvie CROME, MD  testosterone cypionate (DEPOTESTOSTERONE CYPIONATE) 200 MG/ML injection Inject 200 mg into the muscle every 14 (fourteen) days. 07/26/20   [provider]  Tetrahydrozoline HCl (VISINE OP) Place 1 drop into both eyes daily as needed (dry eyes). Patient not taking: Reported on 02/06/2022    [provider]    Allergies: Simvastatin    Review of Systems  Gastrointestinal:  Positive for abdominal distention.  Neurological:  Positive for light-headedness.  All other systems reviewed and are negative.   Updated Vital Signs BP (!) 143/94 (BP Location: Left Arm)   Pulse 79   Temp 97.6 F (36.4 C)   Resp 18   Wt 113.4 kg   SpO2 95%   BMI 32.10 kg/m   Physical Exam Vitals and nursing note reviewed.  Constitutional:      General: He is not in acute distress.    Appearance: He is well-developed. He is not ill-appearing, toxic-appearing or diaphoretic.  HENT:     Head: Normocephalic and atraumatic.  Eyes:     Conjunctiva/sclera: Conjunctivae normal.  Cardiovascular:     Rate and Rhythm: Normal rate and regular rhythm.  Pulmonary:     Effort: Pulmonary effort is normal. No respiratory  distress.  Abdominal:     Palpations: Abdomen is soft.     Tenderness: There is no abdominal tenderness.  Musculoskeletal:        General: No swelling.     Cervical back: Neck supple.  Skin:    General: Skin is warm and dry.  Neurological:     General: No focal deficit present.     Mental Status: He is alert and oriented to person, place, and time.  Psychiatric:        Mood and Affect: Mood normal.        Behavior: Behavior normal.     (all labs ordered are listed, but only abnormal results are displayed) Labs Reviewed  LIPASE, BLOOD - Abnormal; Notable for the following components:       Result Value   Lipase 67 (*)    All other components within normal limits  COMPREHENSIVE METABOLIC PANEL WITH GFR - Abnormal; Notable for the following components:   Glucose, Bld 116 (*)    Creatinine, Ser 1.36 (*)    GFR, Estimated 57 (*)    All other components within normal limits  CBC - Abnormal; Notable for the following components:   Hemoglobin 17.5 (*)    HCT 53.1 (*)    All other components within normal limits  URINALYSIS, ROUTINE W REFLEX MICROSCOPIC  PRO BRAIN NATRIURETIC PEPTIDE  MAGNESIUM  TROPONIN T, HIGH SENSITIVITY  TROPONIN T, HIGH SENSITIVITY    EKG: None  Radiology: CT ANGIO HEAD NECK W WO CM Result Date: 12/06/2024 EXAM: CTA HEAD AND NECK WITH AND WITHOUT 12/06/2024 07:23:18 PM TECHNIQUE: CTA of the head and neck was performed with and without the administration of 75 mL of intravenous iohexol  (OMNIPAQUE ) 350 MG/ML injection. Multiplanar 2D and/or 3D reformatted images are provided for review. Automated exposure control, iterative reconstruction, and/or weight based adjustment of the mA/kV was utilized to reduce the radiation dose to as low as reasonably achievable. Stenosis of the internal carotid arteries measured using NASCET criteria. COMPARISON: None available CLINICAL HISTORY: Transient ischemic attack (TIA). FINDINGS: CTA NECK: AORTIC ARCH AND ARCH VESSELS: No dissection or arterial injury. No significant stenosis of the brachiocephalic or subclavian arteries. CERVICAL CAROTID ARTERIES: Mild atherosclerosis at the right carotid bifurcation without hemodynamically significant stenosis. Mild atherosclerosis at the left carotid bifurcation without hemodynamically significant stenosis. No dissection or arterial injury. CERVICAL VERTEBRAL ARTERIES: No dissection, arterial injury, or significant stenosis. LUNGS AND MEDIASTINUM: Cystic foci noted within the left upper lobe. SOFT TISSUES: Calcifications in the left palatine tonsil likely reflecting tonsilloliths.  BONES: Mild degenerative changes in the visualized spine. CTA HEAD: ANTERIOR CIRCULATION: Minimal atherosclerosis of the carotid siphons without stenosis. 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 No significant stenosis of the left anterior cerebral artery. No significant stenosis of the middle cerebral arteries. No aneurysm. POSTERIOR CIRCULATION: Fetal origin of the right PCA. No significant stenosis of the left posterior cerebral artery. No significant stenosis of the basilar artery. No significant stenosis of the vertebral arteries. No aneurysm. OTHER: There is hypoattenuation in the right frontal periventricular white matter likely related to chronic microvascular ischemic changes. Cerebral volume is within normal limits for patient's age. No dural venous sinus thrombosis on this non-dedicated study. IMPRESSION: 1. No large vessel occlusion, hemodynamically significant stenosis, or aneurysm in the head or neck. 2. Mild atherosclerosis at the right and left carotid bifurcations and minimal atherosclerosis of the carotid siphons. No hemodynamically significant stenosis. 3. Hypoattenuation in  the right frontal periventricular white matter likely related to chronic microvascular ischemic changes. Electronically signed by: Donnice Mania MD 12/06/2024 08:00 PM EST RP Workstation: [id number]   CT ABDOMEN PELVIS WO CONTRAST Result Date: 12/06/2024 EXAM: CT ABDOMEN AND PELVIS WITHOUT CONTRAST 12/06/2024 07:23:18 PM TECHNIQUE: CT of the abdomen and pelvis was performed without the administration of intravenous contrast. Multiplanar reformatted images are provided for review. Automated exposure control, iterative reconstruction, and/or weight-based adjustment of the mA/kV was utilized to reduce the radiation dose to as low as reasonably achievable. COMPARISON: None available. CLINICAL HISTORY: Abdominal pain, acute, nonlocalized. Diarrhea and constipation intermittently.  Light headedness. FINDINGS: LOWER CHEST: Lung bases are clear. LIVER: Regional low attenuation throughout the left lobe of the liver, likely representing focal fatty infiltration. GALLBLADDER AND BILE DUCTS: Gallbladder is unremarkable. No biliary ductal dilatation. SPLEEN: Spleen is unremarkable. PANCREAS: Pancreas is unremarkable. ADRENAL GLANDS: Adrenal glands are unremarkable. KIDNEYS, URETERS AND BLADDER: Several bilateral renal stones. The largest in the right lower pole measures 6 mm diameter. No hydronephrosis or hydroureter. No ureteral stones or bladder stones. The bladder is unremarkable. GI AND BOWEL: Small esophageal hiatal hernia. The stomach, small bowel, and colon are not abnormally distended. No wall thickening or inflammatory stranding is demonstrated. The appendix is surgically absent. PERITONEUM AND RETROPERITONEUM: No ascites. No free air. VASCULATURE: Calcification of the aorta. No aneurysm. Aorta is normal in caliber. LYMPH NODES: No significant pelvic lymphadenopathy. No retroperitoneal lymphadenopathy. REPRODUCTIVE ORGANS: The prostate gland is not enlarged. BONES AND SOFT TISSUES: Degenerative changes in the spine. No acute bony abnormalities. Postoperative bilateral hip arthroplasties. No focal soft tissue abnormality. IMPRESSION: 1. No acute findings in the abdomen or pelvis. 2. Several bilateral renal stones, largest in the right lower pole measuring 6 mm diameter, without hydronephrosis or hydroureter. Electronically signed by: Elsie Gravely MD 12/06/2024 07:32 PM EST RP Workstation: [id number]     Procedures   Medications Ordered in the ED  iohexol  (OMNIPAQUE ) 300 MG/ML solution 100 mL (has no administration in time range)  iohexol  (OMNIPAQUE ) 350 MG/ML injection 75 mL (75 mLs Intravenous Contrast Given 12/06/24 1908)                                    Medical Decision Making Amount and/or Complexity of Data Reviewed Labs: ordered. Radiology:  ordered.  Risk Prescription drug management.   This patient presents to the ED for concern of episodes of lightheadedness, this involves an extensive number of treatment options, and is a complaint that carries with it a high risk of complications and morbidity.  The differential diagnosis includes orthostatic hypotension, dehydration, anemia, polypharmacy, anxiety, arrhythmia   Co morbidities / Chronic conditions that complicate the patient evaluation  arthritis, BPH, HLD, HTN   Additional history obtained:  Additional history obtained from EMR External records from outside source obtained and reviewed including N/A   Lab Tests:  I Ordered, and personally interpreted labs.  The pertinent results include: creatinine has improved from prior lab work, electrolytes are normal.  Mild erythrocytosis likely secondary to testosterone supplement.  No leukocytosis is present.  Troponin and BNP are normal.   Imaging Studies ordered:  I ordered imaging studies including CTA head and neck; CT of abdomen and pelvis I independently visualized and interpreted imaging which showed no acute findings I agree with the radiologist interpretation   Cardiac Monitoring: / EKG:  The patient was maintained on a cardiac monitor.  I personally viewed and interpreted the cardiac monitored which showed an underlying rhythm of: Sinus rhythm   Problem List / ED Course / Critical interventions / Medication management  Patient presenting for intermittent episodes of lightheadedness in addition to some gurgling and fullness sensation in his abdomen.  On arrival in the ED, his vital signs are normal.  Patient is well-appearing on exam.  He is currently asymptomatic.  His abdomen is nontender.  He has no focal neurologic deficits.  Workup was initiated.  Patient's lab work is reassuring.  On CTA of head and neck as well as CT of abdomen and pelvis, no acute findings were identified.  He remained asymptomatic  while in the emergency department.  Patient advised to continue to follow-up with his PCP.  Cardiology referral was ordered as well.  Patient is not currently established with a cardiologist.  At this time, he is stable for discharge.  Social Determinants of Health:  Lives independently     Final diagnoses:  Episodic lightheadedness    ED Discharge Orders          Ordered    Ambulatory referral to Cardiology       Comments: If you have not heard from the Cardiology office within the next 72 hours please call [phone].   12/06/24 2021               Melvenia Motto, MD 12/06/24 2023  "

## 2024-12-06 NOTE — Discharge Instructions (Addendum)
 Your test results today were reassuring.  You should re-establish care with a cardiologist for consideration of long-term heart monitoring.  A referral has been ordered for our cardiology group's office in [place].  You should hear from them in the next few days.  If you do not hear from them, their telephone number is below.  Continue to follow-up with your PCP as well.  Return to the emergency department for any new or worsening symptoms of concern.

## 2024-12-06 NOTE — ED Triage Notes (Signed)
 Pt reports he recently started celexa and has been having issues with random episodes of feeling light headed, stomach gurgles and fluctuates between diarrhea and constipation.

## 2025-05-02 ENCOUNTER — Ambulatory Visit: Admitting: Urology
